# Patient Record
Sex: Male | Born: 1950
Health system: Southern US, Community
[De-identification: ages and names within clinical notes are randomized; demographics above are authoritative.]

## PROBLEM LIST (undated history)

## (undated) DIAGNOSIS — E785 Hyperlipidemia, unspecified: Secondary | ICD-10-CM

## (undated) DIAGNOSIS — J302 Other seasonal allergic rhinitis: Secondary | ICD-10-CM

## (undated) DIAGNOSIS — N529 Male erectile dysfunction, unspecified: Secondary | ICD-10-CM

## (undated) DIAGNOSIS — K219 Gastro-esophageal reflux disease without esophagitis: Secondary | ICD-10-CM

---

## 2006-06-15 ENCOUNTER — Ambulatory Visit: Payer: Self-pay | Admitting: Gastroenterology

## 2006-06-29 ENCOUNTER — Ambulatory Visit: Payer: Self-pay | Admitting: Gastroenterology

## 2006-07-13 ENCOUNTER — Ambulatory Visit: Payer: Self-pay | Admitting: Gastroenterology

## 2006-07-26 ENCOUNTER — Ambulatory Visit: Payer: Self-pay | Admitting: Gastroenterology

## 2009-11-13 ENCOUNTER — Ambulatory Visit: Payer: Self-pay | Admitting: Gastroenterology

## 2014-05-08 DIAGNOSIS — K219 Gastro-esophageal reflux disease without esophagitis: Secondary | ICD-10-CM | POA: Insufficient documentation

## 2014-05-13 ENCOUNTER — Ambulatory Visit: Payer: Self-pay | Admitting: Family Medicine

## 2014-12-23 DIAGNOSIS — J0100 Acute maxillary sinusitis, unspecified: Secondary | ICD-10-CM | POA: Diagnosis not present

## 2015-05-13 DIAGNOSIS — E780 Pure hypercholesterolemia: Secondary | ICD-10-CM | POA: Diagnosis not present

## 2015-05-13 DIAGNOSIS — K219 Gastro-esophageal reflux disease without esophagitis: Secondary | ICD-10-CM | POA: Diagnosis not present

## 2015-05-13 DIAGNOSIS — Z125 Encounter for screening for malignant neoplasm of prostate: Secondary | ICD-10-CM | POA: Diagnosis not present

## 2015-05-13 DIAGNOSIS — Z0000 Encounter for general adult medical examination without abnormal findings: Secondary | ICD-10-CM | POA: Diagnosis not present

## 2015-05-13 DIAGNOSIS — J302 Other seasonal allergic rhinitis: Secondary | ICD-10-CM | POA: Diagnosis not present

## 2015-05-28 DIAGNOSIS — H2513 Age-related nuclear cataract, bilateral: Secondary | ICD-10-CM | POA: Diagnosis not present

## 2015-07-10 ENCOUNTER — Encounter: Payer: Self-pay | Admitting: *Deleted

## 2015-07-13 ENCOUNTER — Ambulatory Visit: Payer: Commercial Managed Care - HMO | Admitting: Anesthesiology

## 2015-07-13 ENCOUNTER — Ambulatory Visit
Admission: RE | Admit: 2015-07-13 | Discharge: 2015-07-13 | Disposition: A | Discharge status: Home / Self Care | Payer: Commercial Managed Care - HMO | Source: Ambulatory Visit | Attending: Gastroenterology | Admitting: Gastroenterology

## 2015-07-13 ENCOUNTER — Encounter
Admission: RE | Disposition: A | Discharge status: Home / Self Care | Payer: Self-pay | Source: Ambulatory Visit | Attending: Gastroenterology

## 2015-07-13 DIAGNOSIS — D123 Benign neoplasm of transverse colon: Secondary | ICD-10-CM | POA: Insufficient documentation

## 2015-07-13 DIAGNOSIS — Z86010 Personal history of colonic polyps: Secondary | ICD-10-CM | POA: Diagnosis not present

## 2015-07-13 DIAGNOSIS — K635 Polyp of colon: Secondary | ICD-10-CM | POA: Diagnosis not present

## 2015-07-13 DIAGNOSIS — E785 Hyperlipidemia, unspecified: Secondary | ICD-10-CM | POA: Diagnosis not present

## 2015-07-13 DIAGNOSIS — D125 Benign neoplasm of sigmoid colon: Secondary | ICD-10-CM | POA: Diagnosis not present

## 2015-07-13 DIAGNOSIS — Z1211 Encounter for screening for malignant neoplasm of colon: Secondary | ICD-10-CM | POA: Diagnosis not present

## 2015-07-13 DIAGNOSIS — D120 Benign neoplasm of cecum: Secondary | ICD-10-CM | POA: Insufficient documentation

## 2015-07-13 DIAGNOSIS — K5790 Diverticulosis of intestine, part unspecified, without perforation or abscess without bleeding: Secondary | ICD-10-CM | POA: Diagnosis not present

## 2015-07-13 DIAGNOSIS — K5730 Diverticulosis of large intestine without perforation or abscess without bleeding: Secondary | ICD-10-CM | POA: Diagnosis not present

## 2015-07-13 HISTORY — PX: COLONOSCOPY WITH PROPOFOL: SHX5780

## 2015-07-13 HISTORY — DX: Gastro-esophageal reflux disease without esophagitis: K21.9

## 2015-07-13 HISTORY — DX: Other seasonal allergic rhinitis: J30.2

## 2015-07-13 HISTORY — DX: Male erectile dysfunction, unspecified: N52.9

## 2015-07-13 HISTORY — DX: Hyperlipidemia, unspecified: E78.5

## 2015-07-13 SURGERY — COLONOSCOPY WITH PROPOFOL
Anesthesia: General

## 2015-07-13 MED ORDER — PROPOFOL INFUSION 10 MG/ML OPTIME
INTRAVENOUS | Status: DC | PRN
  Administered 2015-07-13: 140 ug/kg/min via INTRAVENOUS

## 2015-07-13 MED ORDER — SODIUM CHLORIDE 0.9 % IV SOLN
INTRAVENOUS | Status: DC
  Administered 2015-07-13: 1000 mL via INTRAVENOUS

## 2015-07-13 MED ORDER — MIDAZOLAM HCL 2 MG/2ML IJ SOLN
INTRAMUSCULAR | Status: DC | PRN
  Administered 2015-07-13: 1 mg via INTRAVENOUS

## 2015-07-13 MED ORDER — FENTANYL CITRATE (PF) 100 MCG/2ML IJ SOLN
INTRAMUSCULAR | Status: DC | PRN
  Administered 2015-07-13: 50 ug via INTRAVENOUS

## 2015-07-13 NOTE — Op Note (Signed)
[facility] Gastroenterology Patient Name: Alejandro Fox Procedure Date: 07/13/2015 11:17 AM MRN: [id number] Account #: [id number] Date of Birth: [birth year] Admit Type: Outpatient Age: 64 Room: [facility] ENDO ROOM 3 Gender: Male Note Status: Finalized Procedure:         Colonoscopy Indications:       Personal history of colonic polyps Providers:         Lollie Sails, MD Referring MD:      Sofie Hartigan (Referring MD) Medicines:         Monitored Anesthesia Care Complications:     No immediate complications. Procedure:         Pre-Anesthesia Assessment:                    - ASA Grade Assessment: II - A patient with mild systemic                     disease.                    After obtaining informed consent, the colonoscope was                     passed under direct vision. Throughout the procedure, the                     patient's blood pressure, pulse, and oxygen saturations                     were monitored continuously. The Colonoscope was                     introduced through the anus and advanced to the the cecum,                     identified by appendiceal orifice and ileocecal valve. The                     quality of the bowel preparation was good. Findings:      Two sessile polyps were found at the hepatic flexure. The polyps were 1       to 4 mm in size. These polyps were removed with a cold biopsy forceps.       Resection and retrieval were complete.      A 3 mm polyp was found in the cecum. The polyp was flat. The polyp was       removed with a cold snare. Resection and retrieval were complete.      A 2 mm polyp was found in the sigmoid colon. The polyp was sessile. The       polyp was removed with a cold biopsy forceps. Resection and retrieval       were complete.      Multiple small-mouthed diverticula were found in the sigmoid colon, at       the splenic flexure and in the proximal transverse colon.      The retroflexed view of the  distal rectum and anal verge was normal and       showed no anal or rectal abnormalities.      The digital rectal exam was normal. Impression:        - Two 1 to 4 mm polyps at the hepatic flexure. Resected  and retrieved.                    - One 3 mm polyp in the cecum. Resected and retrieved.                    - One 2 mm polyp in the sigmoid colon. Resected and                     retrieved.                    - Diverticulosis in the sigmoid colon, at the splenic                     flexure and in the proximal transverse colon.                    - The distal rectum and anal verge are normal on                     retroflexion view. Recommendation:    - Await pathology results.                    - Telephone GI clinic for pathology results in 1 week. Procedure Code(s): --- Professional ---                    [phone], Colonoscopy, flexible; with removal of tumor(s),                     polyp(s), or other lesion(s) by snare technique                    45380, 48, Colonoscopy, flexible; with biopsy, single or                     multiple Diagnosis Code(s): --- Professional ---                    211.3, Benign neoplasm of colon                    V12.72, Personal history of colonic polyps                    562.10, Diverticulosis of colon (without mention of                     hemorrhage) CPT copyright 2014 American Medical Association. All rights reserved. The codes documented in this report are preliminary and upon coder review may  be revised to meet current compliance requirements. Lollie Sails, MD 07/13/2015 11:52:54 AM This report has been signed electronically. Number of Addenda: 0 Note Initiated On: 07/13/2015 11:17 AM Scope Withdrawal Time: 0 hours 14 minutes 7 seconds  Total Procedure Duration: 0 hours 23 minutes 12 seconds       [facility]

## 2015-07-13 NOTE — Anesthesia Postprocedure Evaluation (Signed)
  Anesthesia Post-op Note  Patient: Alejandro Fox  Procedure(s) Performed: Procedure(s): COLONOSCOPY WITH PROPOFOL (N/A)  Anesthesia type:General  Patient location: PACU  Post pain: Pain level controlled  Post assessment: Post-op Vital signs reviewed, Patient's Cardiovascular Status Stable, Respiratory Function Stable, Patent Airway and No signs of Nausea or vomiting  Post vital signs: Reviewed and stable  Last Vitals:  Filed Vitals:   07/13/15 1230  BP:   Pulse: 50  Temp:   Resp: 13    Level of consciousness: awake, alert  and patient cooperative  Complications: No apparent anesthesia complications

## 2015-07-13 NOTE — Transfer of Care (Signed)
Immediate Anesthesia Transfer of Care Note  Patient: Alejandro Fox  Procedure(s) Performed: Procedure(s): COLONOSCOPY WITH PROPOFOL (N/A)  Patient Location: PACU  Anesthesia Type:General  Level of Consciousness: sedated and responds to stimulation  Airway & Oxygen Therapy: Patient Spontanous Breathing and Patient connected to nasal cannula oxygen  Post-op Assessment: Report given to RN and Post -op Vital signs reviewed and stable  Post vital signs: Reviewed and stable  Last Vitals:  Filed Vitals:   07/13/15 1155  BP: 110/71  Pulse: 55  Temp: 36 C  Resp: 16    Complications: No apparent anesthesia complications

## 2015-07-13 NOTE — Anesthesia Preprocedure Evaluation (Signed)
Anesthesia Evaluation  Patient identified by MRN, date of birth, ID band Patient awake    Reviewed: Allergy & Precautions, H&P , NPO status , Patient's Chart, lab work & pertinent test results, reviewed documented beta blocker date and time   History of Anesthesia Complications Negative for: history of anesthetic complications  Airway Mallampati: II  TM Distance: >3 FB Neck ROM: full    Dental no notable dental hx.    Pulmonary neg COPDCurrent Smoker,  breath sounds clear to auscultation  Pulmonary exam normal       Cardiovascular Exercise Tolerance: Good negative cardio ROS Normal cardiovascular examRhythm:regular Rate:Normal     Neuro/Psych negative neurological ROS  negative psych ROS   GI/Hepatic Neg liver ROS, GERD-  Medicated and Controlled,  Endo/Other  negative endocrine ROS  Renal/GU negative Renal ROS  negative genitourinary   Musculoskeletal   Abdominal   Peds  Hematology negative hematology ROS (+)   Anesthesia Other Findings Past Medical History:   Seasonal allergies                                           Erectile dysfunction                                         Hyperlipidemia                                               GERD (gastroesophageal reflux disease)                       Reproductive/Obstetrics negative OB ROS                             Anesthesia Physical Anesthesia Plan  ASA: II  Anesthesia Plan: General   Post-op Pain Management:    Induction:   Airway Management Planned:   Additional Equipment:   Intra-op Plan:   Post-operative Plan:   Informed Consent: I have reviewed the patients History and Physical, chart, labs and discussed the procedure including the risks, benefits and alternatives for the proposed anesthesia with the patient or authorized representative who has indicated his/her understanding and acceptance.   Dental Advisory  Given  Plan Discussed with: Anesthesiologist, CRNA and Surgeon  Anesthesia Plan Comments:         Anesthesia Quick Evaluation

## 2015-07-13 NOTE — H&P (Signed)
Outpatient short stay form Pre-procedure 07/13/2015 11:06 AM Lollie Sails MD  Primary Physician: Dr. Thereasa Distance  Reason for visit:  Colonoscopy  History of present illness:  Patient is a 64 year old male with a personal history of adenomatous colon polyps. It has been 5 years since his last colonoscopy and he is presenting for routine follow-up colonoscopy. He tolerated his prep well. He takes no aspirin or NSAID products. Is no family history of colon cancer or colon polyps.    Current facility-administered medications:  .  0.9 %  sodium chloride infusion, , Intravenous, Continuous, Lollie Sails, MD, Last Rate: 20 mL/hr at 07/13/15 1040, 1,000 mL at 07/13/15 1040  Prescriptions prior to admission  Medication Sig Dispense Refill Last Dose  . cholecalciferol (VITAMIN D) 400 UNITS TABS tablet Take 1,000 Units by mouth.     . Multiple Vitamin (MULTIVITAMIN WITH MINERALS) TABS tablet Take 1 tablet by mouth daily.     Marland Kitchen omega-3 acid ethyl esters (LOVAZA) 1 G capsule Take 2 g by mouth daily.     . tadalafil (CIALIS) 20 MG tablet Take 20 mg by mouth daily as needed for erectile dysfunction.        Allergies  Allergen Reactions  . Ibuprofen Other (See Comments)     Past Medical History  Diagnosis Date  . Seasonal allergies   . Erectile dysfunction   . Hyperlipidemia   . GERD (gastroesophageal reflux disease)     Review of systems:      Physical Exam    Heart and lungs: Regular rate and rhythm without rub or gallop lungs are bilaterally clear    HEENT: Normocephalic atraumatic eyes are anicteric    Other:     Pertinant exam for procedure: Soft nontender nondistended bowel sounds positive normoactive    Planned proceedures: Colonoscopy and indicated procedures I have discussed the risks benefits and complications of procedures to include not limited to bleeding, infection, perforation and the risk of sedation and the patient wishes to proceed.    Lollie Sails, MD Gastroenterology 07/13/2015  11:06 AM

## 2015-07-14 ENCOUNTER — Encounter: Payer: Self-pay | Admitting: Gastroenterology

## 2015-07-14 LAB — SURGICAL PATHOLOGY

## 2015-08-19 DIAGNOSIS — M7521 Bicipital tendinitis, right shoulder: Secondary | ICD-10-CM | POA: Diagnosis not present

## 2016-05-17 DIAGNOSIS — J301 Allergic rhinitis due to pollen: Secondary | ICD-10-CM | POA: Diagnosis not present

## 2016-05-17 DIAGNOSIS — F17220 Nicotine dependence, chewing tobacco, uncomplicated: Secondary | ICD-10-CM | POA: Diagnosis not present

## 2016-05-17 DIAGNOSIS — Z720 Tobacco use: Secondary | ICD-10-CM | POA: Diagnosis not present

## 2016-05-17 DIAGNOSIS — E7800 Pure hypercholesterolemia, unspecified: Secondary | ICD-10-CM | POA: Diagnosis not present

## 2016-05-17 DIAGNOSIS — Z0000 Encounter for general adult medical examination without abnormal findings: Secondary | ICD-10-CM | POA: Diagnosis not present

## 2016-05-31 DIAGNOSIS — H401130 Primary open-angle glaucoma, bilateral, stage unspecified: Secondary | ICD-10-CM | POA: Diagnosis not present

## 2016-05-31 DIAGNOSIS — H2513 Age-related nuclear cataract, bilateral: Secondary | ICD-10-CM | POA: Diagnosis not present

## 2016-06-16 DIAGNOSIS — R319 Hematuria, unspecified: Secondary | ICD-10-CM | POA: Diagnosis not present

## 2016-07-01 ENCOUNTER — Other Ambulatory Visit: Payer: Self-pay | Admitting: Family Medicine

## 2016-07-01 DIAGNOSIS — N200 Calculus of kidney: Secondary | ICD-10-CM

## 2016-07-07 ENCOUNTER — Ambulatory Visit: Payer: Medicare HMO

## 2016-07-12 ENCOUNTER — Ambulatory Visit
Admission: RE | Admit: 2016-07-12 | Discharge: 2016-07-12 | Disposition: A | Discharge status: Home / Self Care | Payer: Commercial Managed Care - HMO | Source: Ambulatory Visit | Attending: Family Medicine | Admitting: Family Medicine

## 2016-07-12 DIAGNOSIS — N200 Calculus of kidney: Secondary | ICD-10-CM | POA: Diagnosis not present

## 2016-07-12 DIAGNOSIS — I700 Atherosclerosis of aorta: Secondary | ICD-10-CM | POA: Insufficient documentation

## 2016-07-12 DIAGNOSIS — N400 Enlarged prostate without lower urinary tract symptoms: Secondary | ICD-10-CM | POA: Insufficient documentation

## 2016-11-01 DIAGNOSIS — M5442 Lumbago with sciatica, left side: Secondary | ICD-10-CM | POA: Diagnosis not present

## 2016-11-01 DIAGNOSIS — Z23 Encounter for immunization: Secondary | ICD-10-CM | POA: Diagnosis not present

## 2016-11-01 DIAGNOSIS — I700 Atherosclerosis of aorta: Secondary | ICD-10-CM | POA: Insufficient documentation

## 2017-01-27 DIAGNOSIS — J301 Allergic rhinitis due to pollen: Secondary | ICD-10-CM | POA: Insufficient documentation

## 2017-01-27 DIAGNOSIS — J029 Acute pharyngitis, unspecified: Secondary | ICD-10-CM | POA: Diagnosis not present

## 2017-01-27 DIAGNOSIS — R030 Elevated blood-pressure reading, without diagnosis of hypertension: Secondary | ICD-10-CM | POA: Diagnosis not present

## 2017-05-18 ENCOUNTER — Other Ambulatory Visit: Payer: Self-pay | Admitting: Family Medicine

## 2017-05-18 DIAGNOSIS — Z136 Encounter for screening for cardiovascular disorders: Secondary | ICD-10-CM

## 2017-05-18 DIAGNOSIS — R7309 Other abnormal glucose: Secondary | ICD-10-CM | POA: Diagnosis not present

## 2017-05-18 DIAGNOSIS — M2550 Pain in unspecified joint: Secondary | ICD-10-CM | POA: Diagnosis not present

## 2017-05-18 DIAGNOSIS — E7800 Pure hypercholesterolemia, unspecified: Secondary | ICD-10-CM | POA: Diagnosis not present

## 2017-05-18 DIAGNOSIS — N200 Calculus of kidney: Secondary | ICD-10-CM | POA: Diagnosis not present

## 2017-05-18 DIAGNOSIS — Z125 Encounter for screening for malignant neoplasm of prostate: Secondary | ICD-10-CM | POA: Diagnosis not present

## 2017-05-18 DIAGNOSIS — Z0000 Encounter for general adult medical examination without abnormal findings: Secondary | ICD-10-CM | POA: Diagnosis not present

## 2017-05-28 IMAGING — CT CT RENAL STONE PROTOCOL
1 of 2 series · 15 of 32 positions shown, 19 images · non-contrast
Comparison: 07/13/2006

CLINICAL DATA: Microscopic hematuria.

EXAM:
CT ABDOMEN AND PELVIS WITHOUT CONTRAST
TECHNIQUE: Multidetector CT imaging of the abdomen and pelvis was performed
following the standard protocol without IV contrast.

[Series 2: axial st · axial · 0.64mm/px · z∈[-886,-510]mm · 15 of 83 slices shown, 19 images]
[im 4/83  soft-tissue]
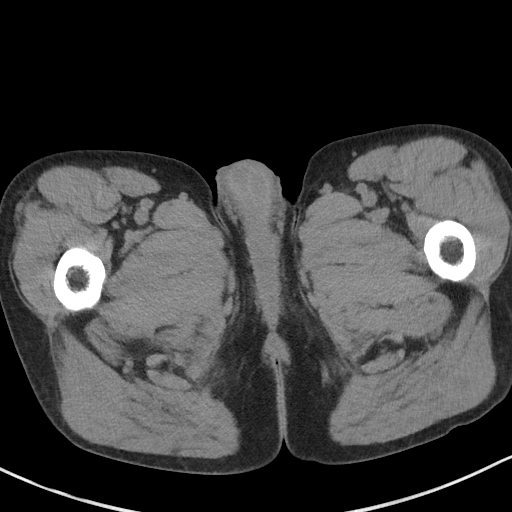
[im 4/83  bone]
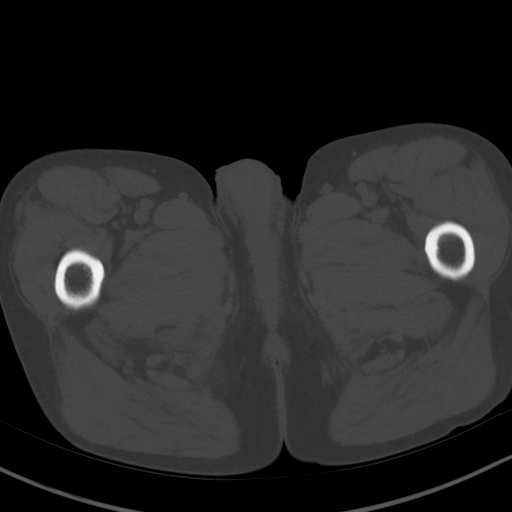
[im 11/83  soft-tissue]
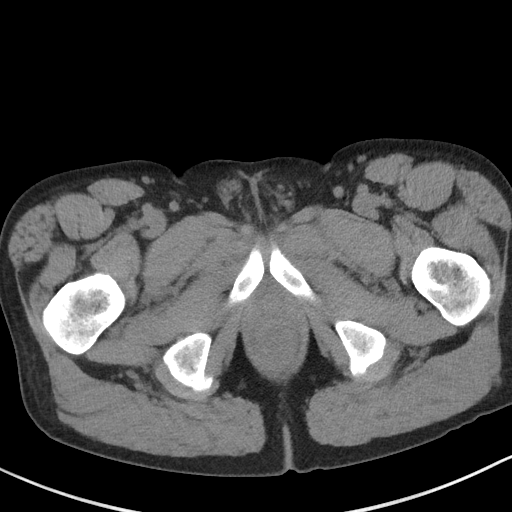
[im 18/83  soft-tissue]
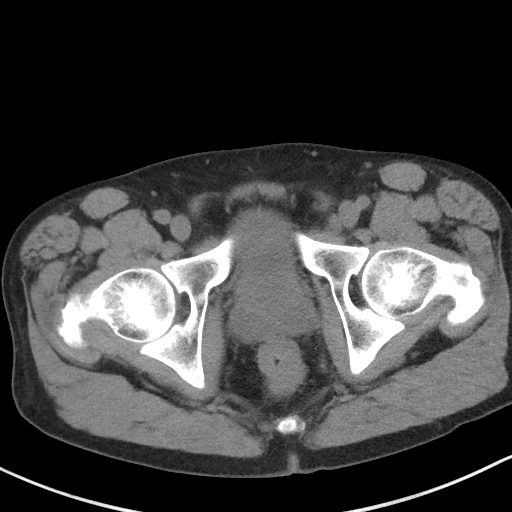
[im 24/83  soft-tissue]
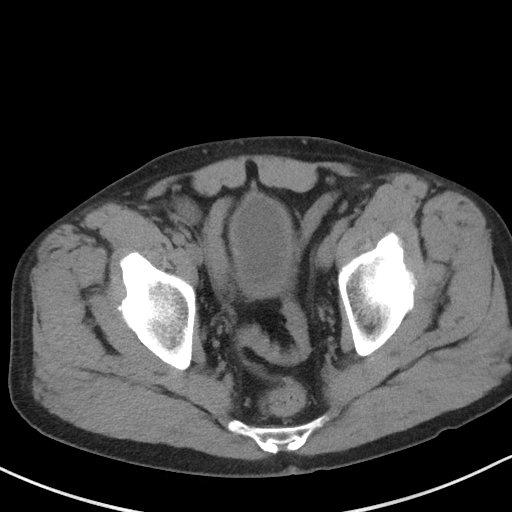
[im 28/83  soft-tissue]
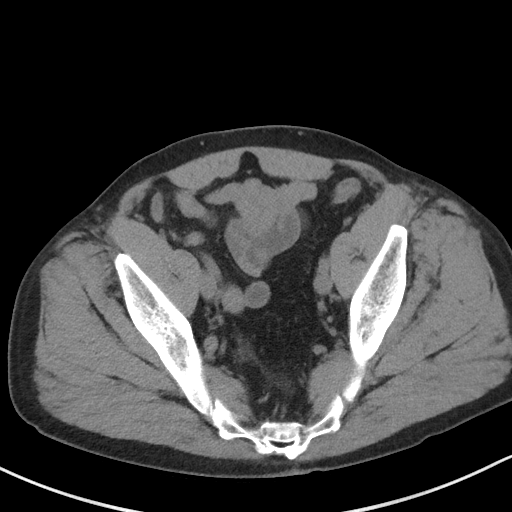
[im 35/83  soft-tissue]
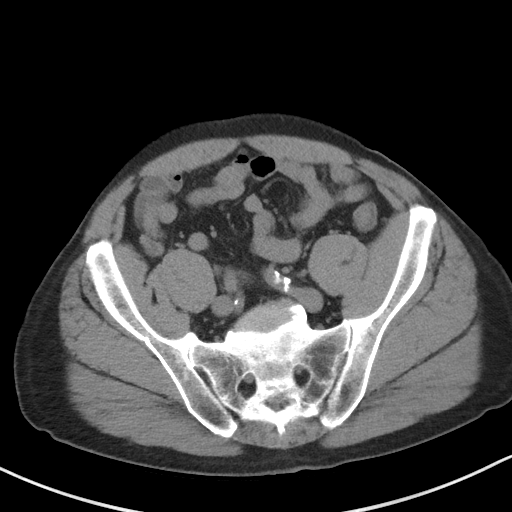
[im 42/83  soft-tissue]
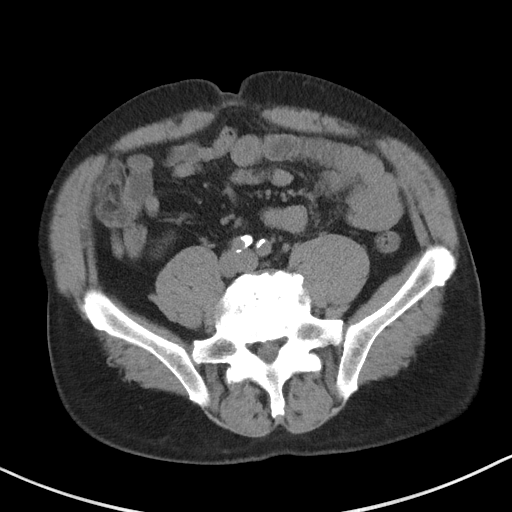
[im 48/83  soft-tissue]
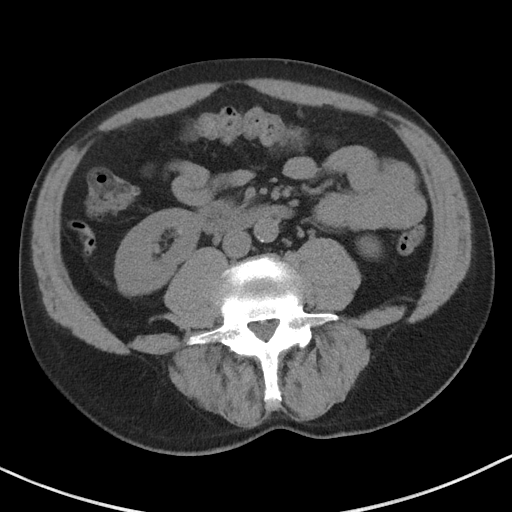
[im 55/83  soft-tissue]
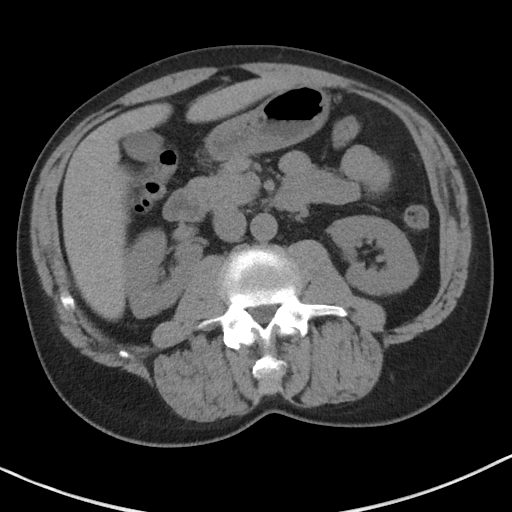
[im 55/83  bone]
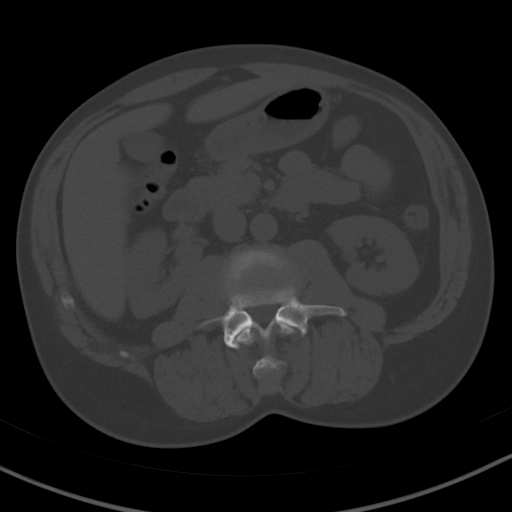
[im 59/83  soft-tissue]
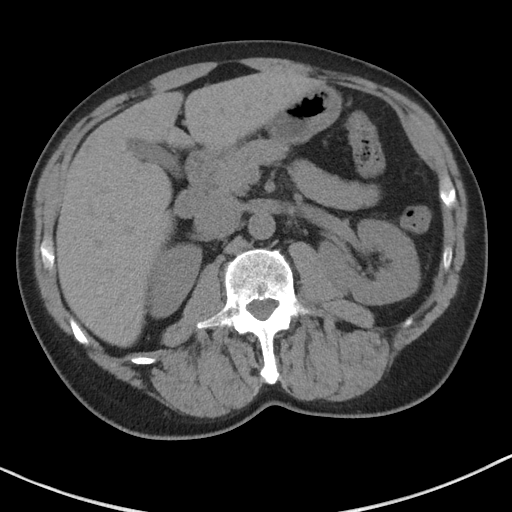
[im 65/83  soft-tissue]
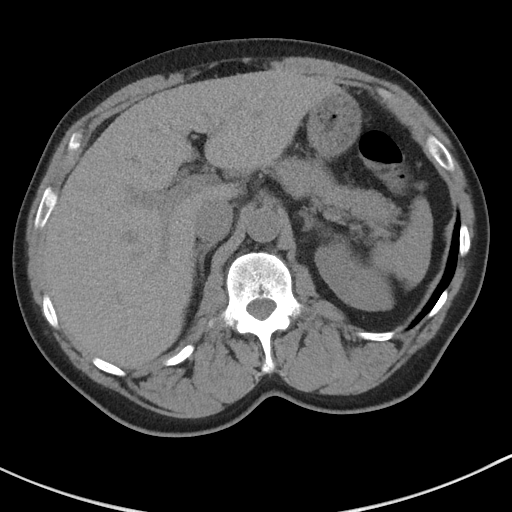
[im 69/83  lung]
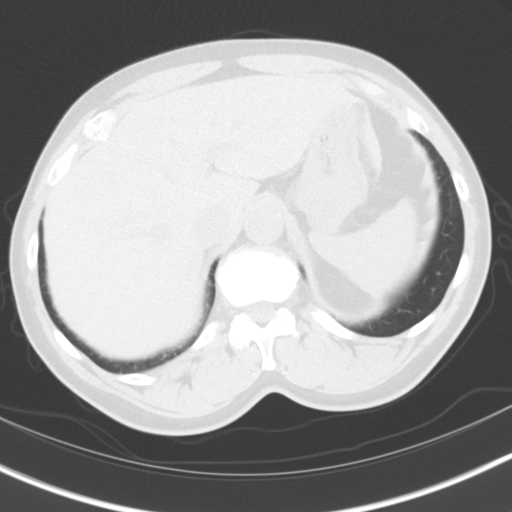
[im 72/83  soft-tissue]
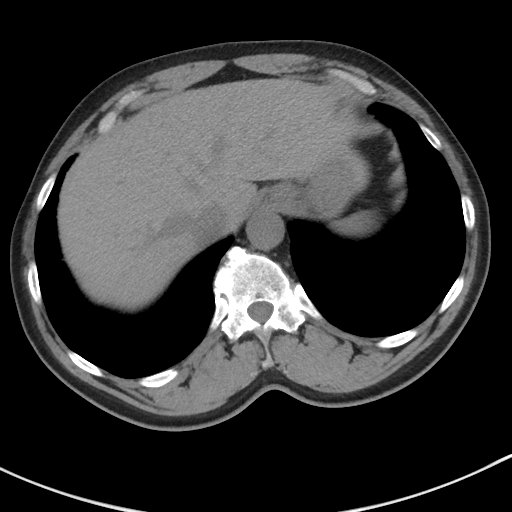
[im 72/83  lung]
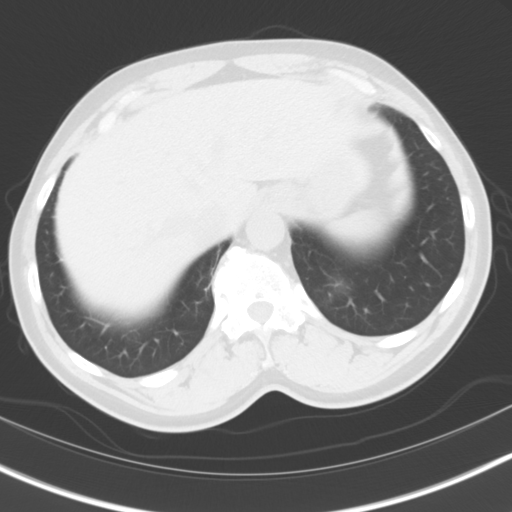
[im 76/83  lung]
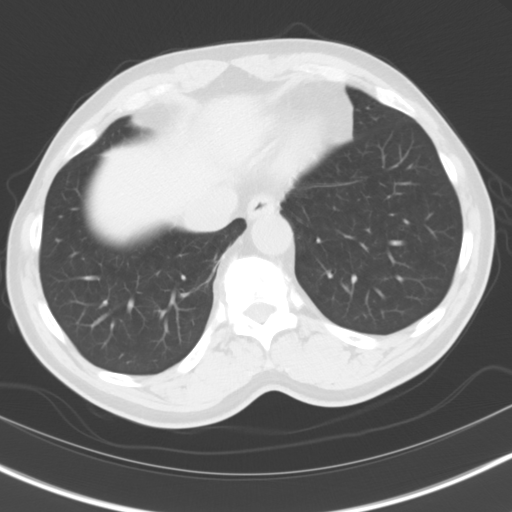
[im 79/83  soft-tissue]
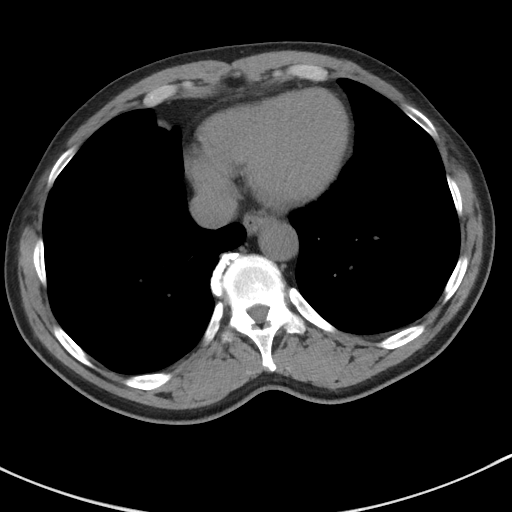
[im 79/83  lung]
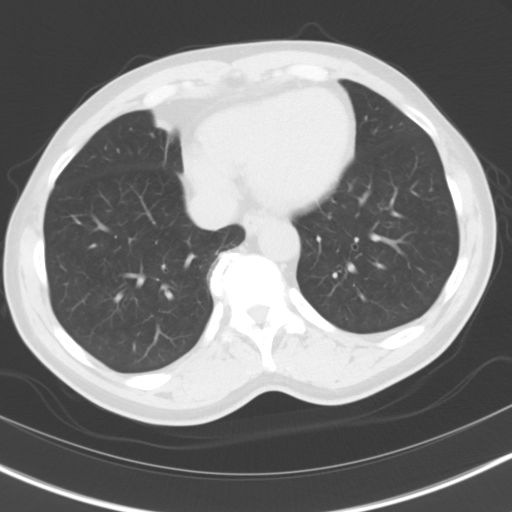

[15 of 32 positions shown; findings below may reference images not displayed]

FINDINGS: Lower chest:  Unremarkable.

Hepatobiliary: No focal abnormality in the liver on this study
without intravenous contrast. There is no evidence for gallstones,
gallbladder wall thickening, or pericholecystic fluid. No
intrahepatic or extrahepatic biliary dilation.

Pancreas: No focal mass lesion. No dilatation of the main duct. No
intraparenchymal cyst. No peripancreatic edema.

Spleen: No splenomegaly. No focal mass lesion.

Adrenals/Urinary Tract: No adrenal nodule or mass. Adjacent
nonobstructing interpolar right renal stones measure up to 6 mm
each. No stones are seen in the left kidney. No ureteral or bladder
stones.

Stomach/Bowel: Stomach is nondistended. No gastric wall thickening.
No evidence of outlet obstruction. Duodenum is normally positioned
as is the ligament of Treitz. No small bowel wall thickening. No
small bowel dilatation. The terminal ileum is normal. The appendix
is normal. No gross colonic mass. No colonic wall thickening. No
substantial diverticular change.

Vascular/Lymphatic: There is abdominal aortic atherosclerosis
without aneurysm. There is no gastrohepatic or hepatoduodenal
ligament lymphadenopathy. No intraperitoneal or retroperitoneal
lymphadenopathy. No pelvic sidewall lymphadenopathy.

Reproductive: Prostate gland is enlarged.

Other: No intraperitoneal free fluid.

Musculoskeletal: Bone windows reveal no worrisome lytic or sclerotic
osseous lesions.
IMPRESSION: 1. Nonobstructing right renal stones measuring up to 6 mm.
2. Prostatomegaly.
3. Abdominal aortic atherosclerosis.

## 2017-05-29 ENCOUNTER — Ambulatory Visit
Admission: RE | Admit: 2017-05-29 | Discharge: 2017-05-29 | Disposition: A | Discharge status: Home / Self Care | Payer: Medicare HMO | Source: Ambulatory Visit | Attending: Family Medicine | Admitting: Family Medicine

## 2017-05-29 DIAGNOSIS — Z136 Encounter for screening for cardiovascular disorders: Secondary | ICD-10-CM | POA: Diagnosis not present

## 2017-05-29 DIAGNOSIS — I714 Abdominal aortic aneurysm, without rupture: Secondary | ICD-10-CM | POA: Diagnosis not present

## 2017-06-13 DIAGNOSIS — H40019 Open angle with borderline findings, low risk, unspecified eye: Secondary | ICD-10-CM | POA: Diagnosis not present

## 2017-06-13 DIAGNOSIS — S0502XA Injury of conjunctiva and corneal abrasion without foreign body, left eye, initial encounter: Secondary | ICD-10-CM | POA: Diagnosis not present

## 2017-06-13 DIAGNOSIS — H401130 Primary open-angle glaucoma, bilateral, stage unspecified: Secondary | ICD-10-CM | POA: Diagnosis not present

## 2017-06-13 DIAGNOSIS — H2513 Age-related nuclear cataract, bilateral: Secondary | ICD-10-CM | POA: Diagnosis not present

## 2017-07-24 DIAGNOSIS — I739 Peripheral vascular disease, unspecified: Secondary | ICD-10-CM | POA: Diagnosis not present

## 2017-07-24 DIAGNOSIS — M2021 Hallux rigidus, right foot: Secondary | ICD-10-CM | POA: Diagnosis not present

## 2017-07-24 DIAGNOSIS — M79671 Pain in right foot: Secondary | ICD-10-CM | POA: Diagnosis not present

## 2017-09-24 ENCOUNTER — Ambulatory Visit
Admission: EM | Admit: 2017-09-24 | Discharge: 2017-09-24 | Disposition: A | Discharge status: Home / Self Care | Payer: Medicare HMO | Attending: Emergency Medicine | Admitting: Emergency Medicine

## 2017-09-24 ENCOUNTER — Encounter: Payer: Self-pay | Admitting: Gynecology

## 2017-09-24 DIAGNOSIS — H5789 Other specified disorders of eye and adnexa: Secondary | ICD-10-CM | POA: Diagnosis not present

## 2017-09-24 MED ORDER — KETOROLAC TROMETHAMINE 0.5 % OP SOLN: OPHTHALMIC | 0 refills | Status: DC

## 2017-09-24 NOTE — ED Provider Notes (Signed)
HPI  SUBJECTIVE:  Alejandro Fox is a 66 y.o. male who presents with a foreign body sensation in his right eye underneath his upper eyelid for the past 3 days. States his symptoms may have started after mowing the lawn but is not sure. He reports blurry vision in this eye and photophobia when going out into the direct sunlight. He reports eye redness. He describes his eye pain as soreness, burning. Has tried Systane and saline irrigation with some improvement in his symptoms. Symptoms are worse with going onto the direct sunlight. No nausea, vomiting, fevers, headaches, increased tearing, discharge. No periorbital erythema, edema, facial rash. No halos around lights, pain worse, and dark. No antipyretic in the past 6-8 hours. He doesn't wear contacts. He wears prescription reading glasses. past medical history negative for diabetes, glaucoma. Tetanus is up-to-date. Ophthalmology: Dr. Clydene Laming in [facility].   Past Medical History:  Diagnosis Date  . Erectile dysfunction   . GERD (gastroesophageal reflux disease)   . Hyperlipidemia   . Seasonal allergies     Past Surgical History:  Procedure Laterality Date  . COLONOSCOPY WITH PROPOFOL N/A 07/13/2015   Procedure: COLONOSCOPY WITH PROPOFOL;  Surgeon: Lollie Sails, MD;  Location: [facility] ENDOSCOPY;  Service: Endoscopy;  Laterality: N/A;    No family history on file.  Social History  Substance Use Topics  . Smoking status: Current Some Day Smoker  . Smokeless tobacco: Never Used  . Alcohol use Yes    No current facility-administered medications for this encounter.   Current Outpatient Prescriptions:  .  cholecalciferol (VITAMIN D) 400 UNITS TABS tablet, Take 1,000 Units by mouth., Disp: , Rfl:  .  Multiple Vitamin (MULTIVITAMIN WITH MINERALS) TABS tablet, Take 1 tablet by mouth daily., Disp: , Rfl:  .  omega-3 acid ethyl esters (LOVAZA) 1 G capsule, Take 2 g by mouth daily., Disp: , Rfl:  .  tadalafil (CIALIS) 20 MG tablet, Take 20 mg by  mouth daily as needed for erectile dysfunction., Disp: , Rfl:  .  ketorolac (ACULAR) 0.5 % ophthalmic solution, 1 drop in affected eye 4 times a day x 5 days, Disp: 3 mL, Rfl: 0  Allergies  Allergen Reactions  . Ibuprofen Other (See Comments)     ROS  As noted in HPI.   Physical Exam  BP (!) 145/74 (BP Location: Left Arm)   Pulse 65   Temp 98.9 F (37.2 C) (Oral)   Ht 5\' 6"  (1.676 m)   Wt 153 lb (69.4 kg)   SpO2 100%   BMI 24.69 kg/m   Constitutional: Well developed, well nourished, no acute distress Eyes: PERRLA, no pain with EOMI, diffuse conjunctival injection right eye. Positive direct photophobia. No consensual photophobia. no foreign body seen on lid eversion. No corneal foreign body or abrasion seen on magnification or flourescin exam. Negative Seidel. No periorbital erythema, edema. pain relieved with the tetracaine.   Visual Acuity  Right Eye Distance: 20/70 Left Eye Distance: 20/40 Bilateral Distance: 20/40 (without corrective lens)  Right Eye Near:   Left Eye Near:    Bilateral Near:     HENT: Normocephalic, atraumatic,mucus membranes moist   Respiratory: Normal inspiratory effort Cardiovascular: Normal rate GI: nondistended skin: No facial rash, periorbital erythema, edema. skin intact Musculoskeletal: no deformities Neurologic: Alert & oriented x 3, no focal neuro deficits Psychiatric: Speech and behavior appropriate   ED Course   Medications - No data to display  No orders of the defined types were placed in this encounter.  No results found for this or any previous visit (from the past 24 hour(s)). No results found.  ED Clinical Impression  Eye irritation   ED Assessment/Plan   presentation suggestive but not completely consistent with of iritis with the decreased visual acuity, photophobia and diffuse conjunctival injection, however iritis usually does not get better with the tetracaine. States normally visual acuity is equal. unable  to find a foreign body or corneal abrasion. May have a small foreign body underneath his lid that I was unable to appreciate on exam. No Evidence of keratitis, ocular herpes simplex, Doubt infection or glaucoma. patient to try irrigation with saline, cool compresses, continue Systane. We'll try ketorolac eyedrops for inflammation. He can follow-up with his ophthalmologist or We'll refer to Dr. Wallace Going,  ophthalmology on-call for follow-up in 24-48 hours.   Discussed  MDM, plan and followup with patient. Discussed sn/sx that should prompt return to the ED. Patient  agrees with plan.   Meds ordered this encounter  Medications  . ketorolac (ACULAR) 0.5 % ophthalmic solution    Sig: 1 drop in affected eye 4 times a day x 5 days    Dispense:  3 mL    Refill:  0    *This clinic note was created using [employer]obby[employer]. Therefore, there may be occasional mistakes despite careful proofreading.  ?   Melynda Ripple, MD 09/24/17 1512

## 2017-09-24 NOTE — Discharge Instructions (Signed)
Cold compresses, Systane, wear sunglasses if the light bothers you. Try the anti-inflammatory eyedrops unless ophthalmologist tells you to stop.

## 2017-09-24 NOTE — ED Triage Notes (Signed)
Per patient couple days mowing yard and not sure if something got into his eye. Patient c/o right eye pain and irritation/ redness.

## 2017-10-01 IMAGING — US US RETROPERITONEAL COMPLETE
1 series · 14 of 25 positions shown · non-contrast
Comparison: CT dated 07/12/2016

CLINICAL DATA: Screening for abdominal aortic aneurysm.

EXAM:
ULTRASOUND RETROPERITONEAL COMPLETE
TECHNIQUE: Ultrasound examination of the abdominal aorta was performed to
evaluate for abdominal aortic aneurysm. The common iliac arteries,
IVC, and kidneys were also evaluated.

[Series 1: us retroperitoneal complete · 0.23mm/px · 14 of 51 slices shown]
[im 1/51]
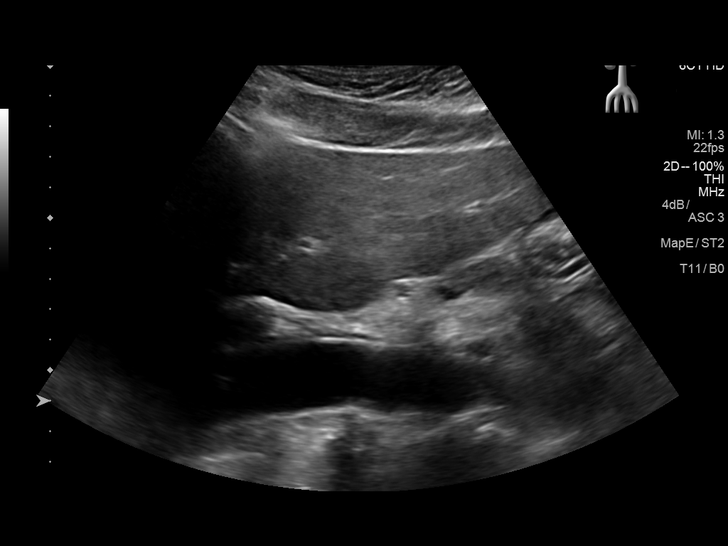
[im 5/51]
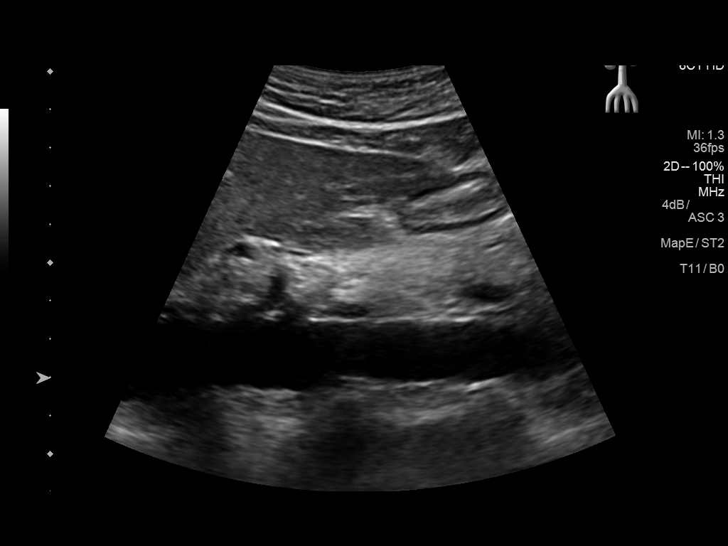
[im 9/51]
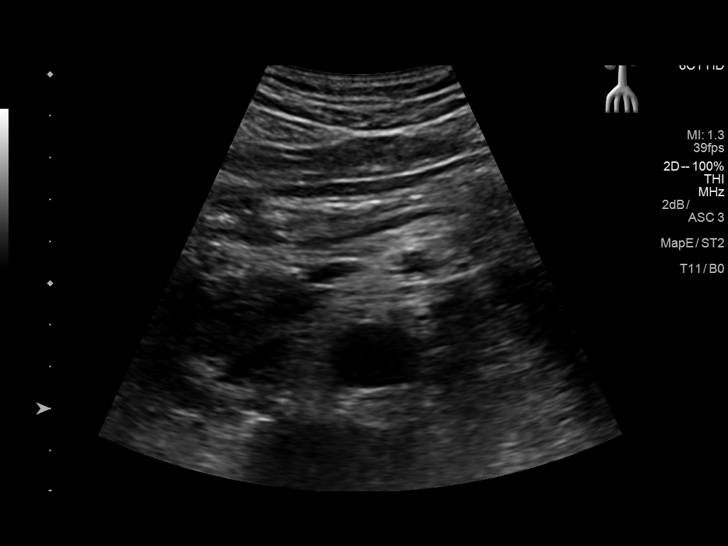
[im 13/51]
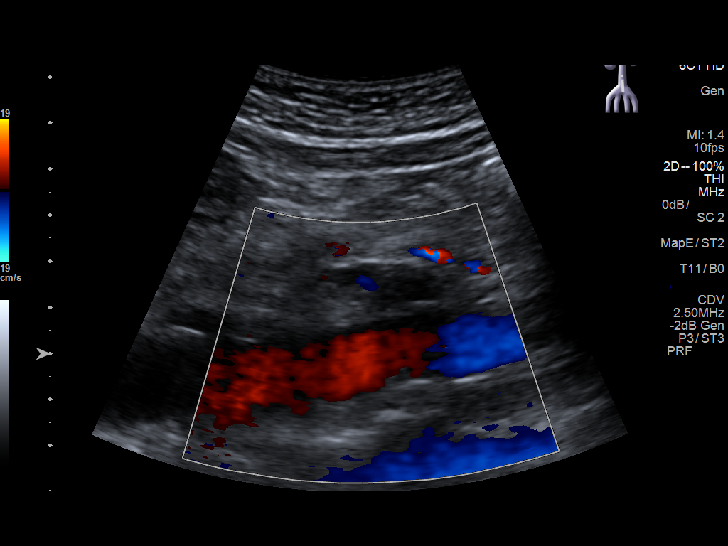
[im 17/51]
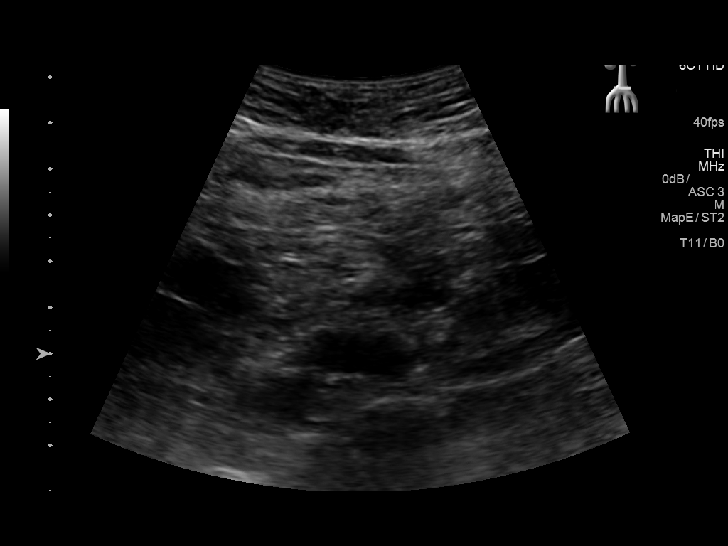
[im 19/51]
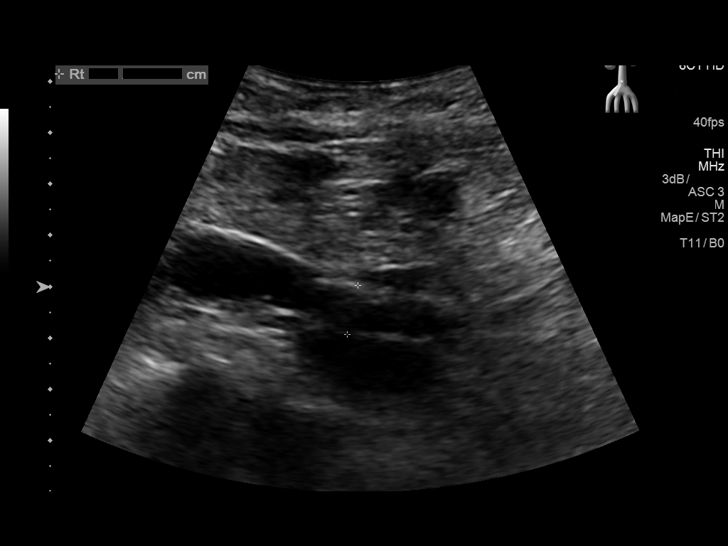
[im 23/51]
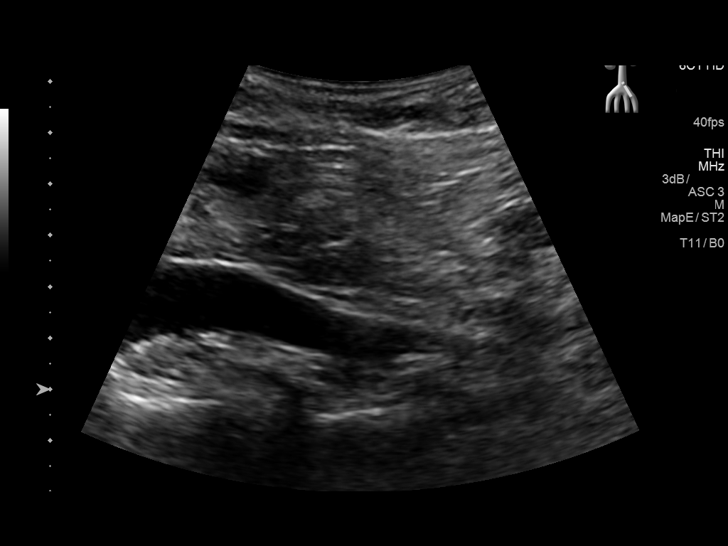
[im 28/51]
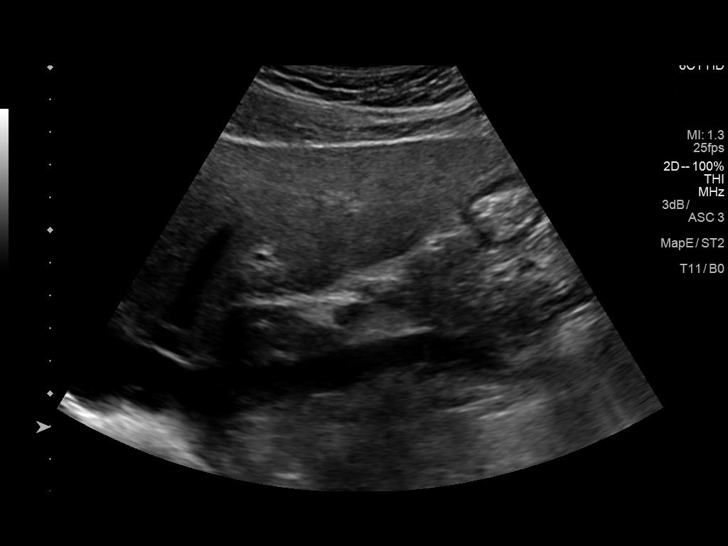
[im 32/51]
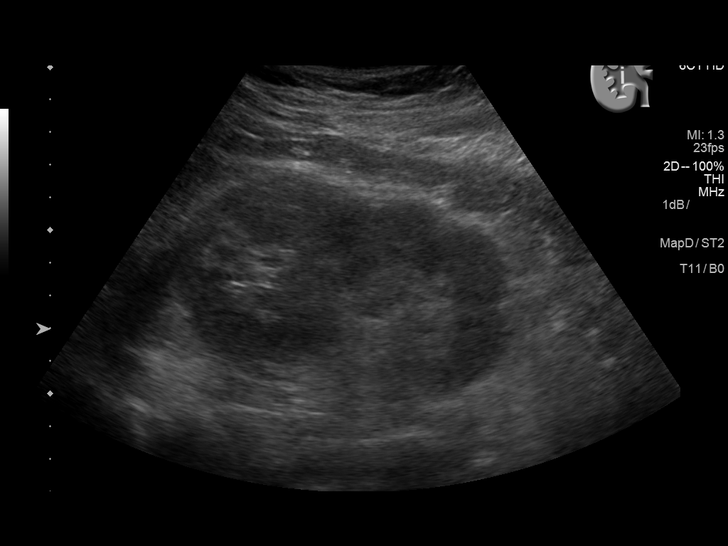
[im 34/51]
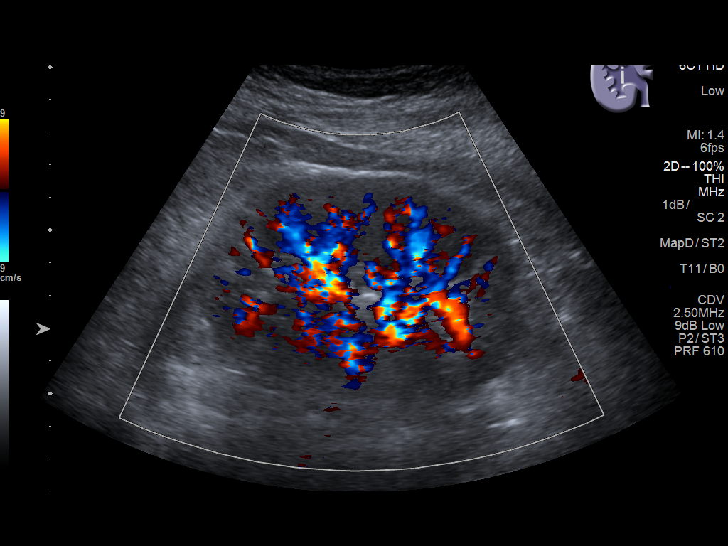
[im 38/51]
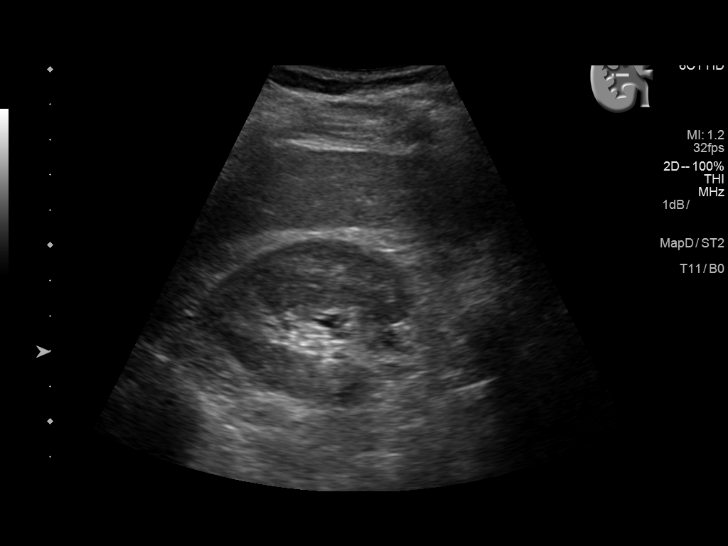
[im 42/51]
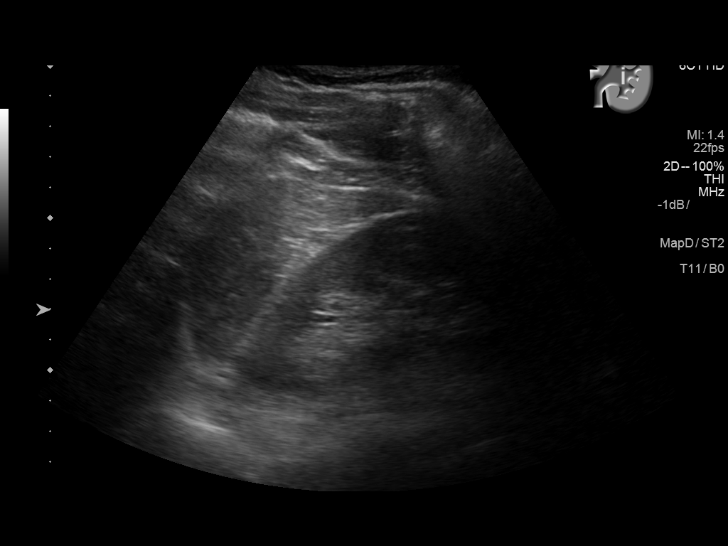
[im 46/51]
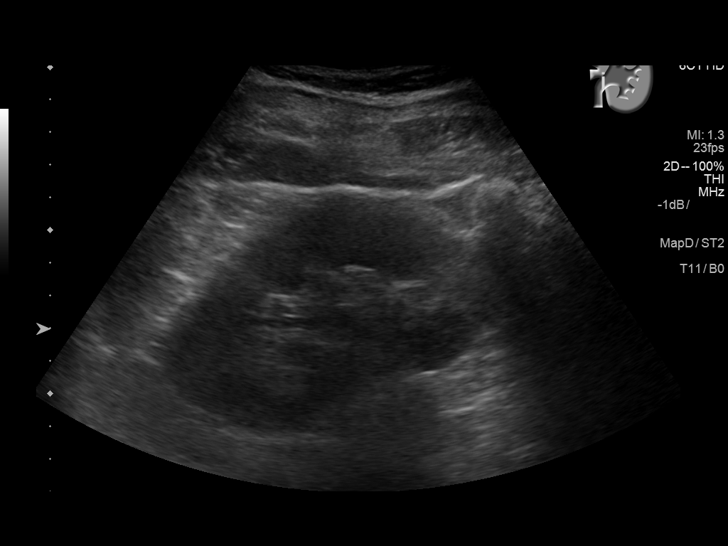
[im 51/51]
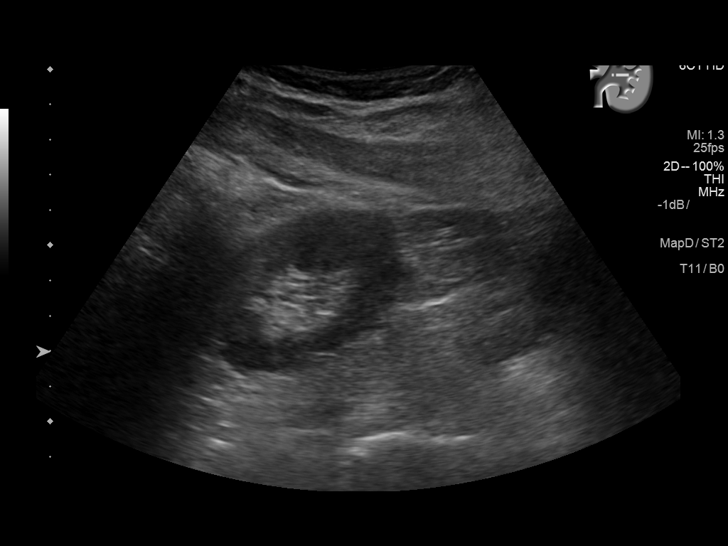

[14 of 25 positions shown; findings below may reference images not displayed]

FINDINGS: Abdominal Aorta

No aneurysm identified.

Maximum AP

Diameter:  2.7 cm

Maximum TRV

Diameter: 2.4 cm

Right Common Iliac Artery

No aneurysm identified.

Left Common Iliac Artery

No aneurysm identified.

IVC

No abnormality visualized.

Right Kidney

Length: 10.4 cm. Echogenicity within normal limits. No mass or
hydronephrosis visualized.

Left Kidney

Length: 10.3 cm. Echogenicity within normal limits. No mass or
hydronephrosis visualized.
IMPRESSION: Negative for an abdominal aortic aneurysm.

Normal appearance of both kidneys.

## 2017-12-04 DIAGNOSIS — M2550 Pain in unspecified joint: Secondary | ICD-10-CM | POA: Diagnosis not present

## 2017-12-04 DIAGNOSIS — R739 Hyperglycemia, unspecified: Secondary | ICD-10-CM | POA: Diagnosis not present

## 2017-12-04 DIAGNOSIS — N200 Calculus of kidney: Secondary | ICD-10-CM | POA: Diagnosis not present

## 2017-12-04 DIAGNOSIS — R7302 Impaired glucose tolerance (oral): Secondary | ICD-10-CM | POA: Diagnosis not present

## 2017-12-04 DIAGNOSIS — E785 Hyperlipidemia, unspecified: Secondary | ICD-10-CM | POA: Diagnosis not present

## 2017-12-04 DIAGNOSIS — E7800 Pure hypercholesterolemia, unspecified: Secondary | ICD-10-CM | POA: Diagnosis not present

## 2017-12-26 DIAGNOSIS — S46911A Strain of unspecified muscle, fascia and tendon at shoulder and upper arm level, right arm, initial encounter: Secondary | ICD-10-CM | POA: Diagnosis not present

## 2018-05-21 ENCOUNTER — Ambulatory Visit
Admission: EM | Admit: 2018-05-21 | Discharge: 2018-05-21 | Disposition: A | Discharge status: Home / Self Care | Payer: Medicare HMO | Attending: Family Medicine | Admitting: Family Medicine

## 2018-05-21 ENCOUNTER — Other Ambulatory Visit: Payer: Self-pay

## 2018-05-21 DIAGNOSIS — E785 Hyperlipidemia, unspecified: Secondary | ICD-10-CM | POA: Insufficient documentation

## 2018-05-21 DIAGNOSIS — K219 Gastro-esophageal reflux disease without esophagitis: Secondary | ICD-10-CM | POA: Insufficient documentation

## 2018-05-21 DIAGNOSIS — Z79899 Other long term (current) drug therapy: Secondary | ICD-10-CM | POA: Insufficient documentation

## 2018-05-21 DIAGNOSIS — R079 Chest pain, unspecified: Secondary | ICD-10-CM | POA: Diagnosis not present

## 2018-05-21 DIAGNOSIS — N529 Male erectile dysfunction, unspecified: Secondary | ICD-10-CM | POA: Diagnosis not present

## 2018-05-21 DIAGNOSIS — R1010 Upper abdominal pain, unspecified: Secondary | ICD-10-CM

## 2018-05-21 DIAGNOSIS — Z833 Family history of diabetes mellitus: Secondary | ICD-10-CM | POA: Insufficient documentation

## 2018-05-21 DIAGNOSIS — F17200 Nicotine dependence, unspecified, uncomplicated: Secondary | ICD-10-CM | POA: Insufficient documentation

## 2018-05-21 DIAGNOSIS — R1013 Epigastric pain: Secondary | ICD-10-CM | POA: Diagnosis not present

## 2018-05-21 DIAGNOSIS — Z886 Allergy status to analgesic agent status: Secondary | ICD-10-CM | POA: Diagnosis not present

## 2018-05-21 LAB — COMPREHENSIVE METABOLIC PANEL
ALT: 24 U/L (ref 17–63)
ANION GAP: 11 (ref 5–15)
AST: 25 U/L (ref 15–41)
Albumin: 4.3 g/dL (ref 3.5–5.0)
Alkaline Phosphatase: 53 U/L (ref 38–126)
BUN: 15 mg/dL (ref 6–20)
CALCIUM: 9.5 mg/dL (ref 8.9–10.3)
CO2: 21 mmol/L — ABNORMAL LOW (ref 22–32)
CREATININE: 0.92 mg/dL (ref 0.61–1.24)
Chloride: 105 mmol/L (ref 101–111)
GFR calc Af Amer: >60 mL/min (ref >60)
GFR calc non Af Amer: >60 mL/min (ref >60)
GLUCOSE: 108 mg/dL — AB (ref 65–99)
Potassium: 4.1 mmol/L (ref 3.5–5.1)
Sodium: 137 mmol/L (ref 135–145)
Total Bilirubin: 0.6 mg/dL (ref 0.3–1.2)
Total Protein: 7.8 g/dL (ref 6.5–8.1)

## 2018-05-21 LAB — CBC WITH DIFFERENTIAL/PLATELET
Basophils Absolute: 0.1 10*3/uL (ref 0–0.1)
Basophils Relative: 1 %
EOS PCT: 2 %
Eosinophils Absolute: 0.1 10*3/uL (ref 0–0.7)
HCT: 42.5 % (ref 40.0–52.0)
HEMOGLOBIN: 13.8 g/dL (ref 13.0–18.0)
Lymphocytes Relative: 35 %
Lymphs Abs: 3 10*3/uL (ref 1.0–3.6)
MCH: 24.4 pg — ABNORMAL LOW (ref 26.0–34.0)
MCHC: 32.4 g/dL (ref 32.0–36.0)
MCV: 75.4 fL — AB (ref 80.0–100.0)
Monocytes Absolute: 0.6 10*3/uL (ref 0.2–1.0)
Monocytes Relative: 8 %
NEUTROS ABS: 4.7 10*3/uL (ref 1.4–6.5)
NEUTROS PCT: 54 %
PLATELETS: 184 10*3/uL (ref 150–440)
RBC: 5.64 MIL/uL (ref 4.40–5.90)
RDW: 15.2 % — ABNORMAL HIGH (ref 11.5–14.5)
WBC: 8.5 10*3/uL (ref 3.8–10.6)

## 2018-05-21 LAB — LIPASE, BLOOD: Lipase: 34 U/L (ref 11–51)

## 2018-05-21 MED ORDER — PANTOPRAZOLE SODIUM 40 MG PO TBEC: 40.0000 mg | DELAYED_RELEASE_TABLET | Freq: Every day | ORAL | 0 refills | Status: DC

## 2018-05-21 NOTE — ED Triage Notes (Signed)
Patient complains of abdominal pain that started on Saturday. Patient states that when he moves certain ways the pain radiates.

## 2018-05-21 NOTE — Discharge Instructions (Signed)
Protonix as prescribed.  Follow up closely with your PCP.  Take care  Dr. Lacinda Axon

## 2018-05-21 NOTE — ED Provider Notes (Signed)
[facility]    CSN: [id number] Arrival date & time: 05/21/18  0851  History   Chief Complaint Chief Complaint  Patient presents with  . Abdominal Pain   HPI  67 year old male presents with abdominal pain.  Patient reports that he developed upper abdominal pain on Saturday.  No associated nausea or vomiting.  No hematochezia or melena.  Denies chest pain or shortness of breath.  Patient states that he has a history of GERD.  He has been using Gas-X and Zantac without improvement.  Patient states that he has drank several beers this past weekend.  His pain sometimes radiates with certain ranges of motion.  No known exacerbating factors.  No other associated symptoms.  No other complaints  Past Medical History:  Diagnosis Date  . Erectile dysfunction   . GERD (gastroesophageal reflux disease)   . Hyperlipidemia   . Seasonal allergies    Past Surgical History:  Procedure Laterality Date  . COLONOSCOPY WITH PROPOFOL N/A 07/13/2015   Procedure: COLONOSCOPY WITH PROPOFOL;  Surgeon: Lollie Sails, MD;  Location: [facility] ENDOSCOPY;  Service: Endoscopy;  Laterality: N/A;   Home Medications    Prior to Admission medications   Medication Sig Start Date End Date Taking? Authorizing Provider  cholecalciferol (VITAMIN D) 400 UNITS TABS tablet Take 1,000 Units by mouth.   Yes [provider]  Multiple Vitamin (MULTIVITAMIN WITH MINERALS) TABS tablet Take 1 tablet by mouth daily.   Yes [provider]  omega-3 acid ethyl esters (LOVAZA) 1 G capsule Take 2 g by mouth daily.   Yes [provider]  tadalafil (CIALIS) 20 MG tablet Take 20 mg by mouth daily as needed for erectile dysfunction.   Yes [provider]  pantoprazole (PROTONIX) 40 MG tablet Take 1 tablet (40 mg total) by mouth daily. 05/21/18   Coral Spikes, DO   Family History Family History  Problem Relation Age of Onset  . Diabetes Father    Social History Social History   Tobacco  Use  . Smoking status: Current Some Day Smoker  . Smokeless tobacco: Never Used  Substance Use Topics  . Alcohol use: Yes    Comment: occasionally  . Drug use: No   Allergies   Ibuprofen  Review of Systems Review of Systems  Constitutional: Negative.   Gastrointestinal: Positive for abdominal pain.   Physical Exam Triage Vital Signs ED Triage Vitals  Enc Vitals Group     BP 05/21/18 0911 (!) 147/81     Pulse Rate 05/21/18 0911 (!) 59     Resp 05/21/18 0911 18     Temp 05/21/18 0911 98.1 F (36.7 C)     Temp Source 05/21/18 0911 Oral     SpO2 05/21/18 0911 99 %     Weight 05/21/18 0910 156 lb (70.8 kg)     Height 05/21/18 0910 5\' 6"  (1.676 m)     Head Circumference --      Peak Flow --      Pain Score 05/21/18 0909 8     Pain Loc --      Pain Edu? --      Excl. in [place]? --    No data found.  Updated Vital Signs BP (!) 147/81 (BP Location: Left Arm)   Pulse (!) 59   Temp 98.1 F (36.7 C) (Oral)   Resp 18   Ht 5\' 6"  (1.676 m)   Wt 156 lb (70.8 kg)   SpO2 99%  BMI 25.18 kg/m   Visual Acuity Right Eye Distance:   Left Eye Distance:   Bilateral Distance:    Right Eye Near:   Left Eye Near:    Bilateral Near:     Physical Exam  Constitutional: He is oriented to person, place, and time. He appears well-developed. No distress.  HENT:  Head: Normocephalic and atraumatic.  Cardiovascular: Regular rhythm.  Bradycardia.  Pulmonary/Chest: Effort normal and breath sounds normal. He has no wheezes. He has no rales.  Abdominal: Soft. He exhibits no distension and no mass. There is no rebound and no guarding.  Tender to palpation in the epigastric region.  Neurological: He is alert and oriented to person, place, and time.  Psychiatric: He has a normal mood and affect. His behavior is normal.  Nursing note and vitals reviewed.  UC Treatments / Results  Labs (all labs ordered are listed, but only abnormal results are displayed) Labs Reviewed  COMPREHENSIVE  METABOLIC PANEL - Abnormal; Notable for the following components:      Result Value   CO2 21 (*)    Glucose, Bld 108 (*)    All other components within normal limits  CBC WITH DIFFERENTIAL/PLATELET - Abnormal; Notable for the following components:   MCV 75.4 (*)    MCH 24.4 (*)    RDW 15.2 (*)    All other components within normal limits  LIPASE, BLOOD    EKG Interpretation: Sinus bradycardia at the rate of 53.  No ST or T wave changes.  Normal intervals.  Normal EKG.   Radiology No results found.  Procedures Procedures (including critical care time)  Medications Ordered in UC Medications - No data to display  Initial Impression / Assessment and Plan / UC Course  I have reviewed the triage vital signs and the nursing notes.  Pertinent labs & imaging results that were available during my care of the patient were reviewed by me and considered in my medical decision making (see chart for details).    67 year old male presents with epigastric pain. Likely gastritis (from alcohol use).  Labs and EKG unremarkable.  Treating with Protonix.  Final Clinical Impressions(s) / UC Diagnoses   Final diagnoses:  Abdominal pain, epigastric     Discharge Instructions     Protonix as prescribed.  Follow up closely with your PCP.  Take care  Dr. Lacinda Axon     ED Prescriptions    Medication Sig Dispense Auth. Provider   pantoprazole (PROTONIX) 40 MG tablet Take 1 tablet (40 mg total) by mouth daily. 30 tablet Coral Spikes, DO     Controlled Substance Prescriptions [place] Controlled Substance Registry consulted? Not Applicable   Coral Spikes, DO 05/21/18 1018

## 2018-05-28 ENCOUNTER — Ambulatory Visit
Admission: EM | Admit: 2018-05-28 | Discharge: 2018-05-28 | Disposition: A | Discharge status: Home / Self Care | Payer: Medicare HMO | Attending: Emergency Medicine | Admitting: Emergency Medicine

## 2018-05-28 ENCOUNTER — Encounter: Payer: Self-pay | Admitting: *Deleted

## 2018-05-28 DIAGNOSIS — M7022 Olecranon bursitis, left elbow: Secondary | ICD-10-CM

## 2018-05-28 NOTE — ED Triage Notes (Signed)
Pt struck left elbow on object 1 week ago. Left elbow now edematous and painful.

## 2018-05-28 NOTE — Discharge Instructions (Addendum)
May take 2 over-the-counter Aleve twice a day as needed for swelling.  Wear the elbow sleeve, use ice for 15 to 20 minutes at a time.  Go immediately to the [facility] urgent care if it appears to get infected.

## 2018-05-28 NOTE — ED Provider Notes (Signed)
HPI  SUBJECTIVE:  Alejandro Fox is a 67 y.o. male who presents with 1 week of swelling of the left olecranon bursa.  He is not sure how long it has been swollen.  States he bumped his left elbow against something last week.  He reports mild tenderness with palpation only, there is no other pain at any other time.  No redness, increased temperature, no fever, body aches.  No limitation of motion of his elbow.  He tried wearing an elbow sleeve one day without improvement in symptoms.  Symptoms are worse when he accidentally hits his elbow against things.  He is a smoker.  No history of diabetes, hypertension, MRSA.  [id number], Chrissie Noa, MD     Past Medical History:  Diagnosis Date  . Erectile dysfunction   . GERD (gastroesophageal reflux disease)   . Hyperlipidemia   . Seasonal allergies     Past Surgical History:  Procedure Laterality Date  . COLONOSCOPY WITH PROPOFOL N/A 07/13/2015   Procedure: COLONOSCOPY WITH PROPOFOL;  Surgeon: Lollie Sails, MD;  Location: [facility] ENDOSCOPY;  Service: Endoscopy;  Laterality: N/A;    Family History  Problem Relation Age of Onset  . Other Mother   . Diabetes Father   . Hypertension Father     Social History   Tobacco Use  . Smoking status: Current Some Day Smoker  . Smokeless tobacco: Never Used  Substance Use Topics  . Alcohol use: Yes    Comment: occasionally  . Drug use: No    No current facility-administered medications for this encounter.   Current Outpatient Medications:  .  cholecalciferol (VITAMIN D) 400 UNITS TABS tablet, Take 1,000 Units by mouth., Disp: , Rfl:  .  Multiple Vitamin (MULTIVITAMIN WITH MINERALS) TABS tablet, Take 1 tablet by mouth daily., Disp: , Rfl:  .  omega-3 acid ethyl esters (LOVAZA) 1 G capsule, Take 2 g by mouth daily., Disp: , Rfl:  .  pantoprazole (PROTONIX) 40 MG tablet, Take 1 tablet (40 mg total) by mouth daily., Disp: 30 tablet, Rfl: 0 .  tadalafil (CIALIS) 20 MG tablet, Take 20 mg by mouth  daily as needed for erectile dysfunction., Disp: , Rfl:   Allergies  Allergen Reactions  . Ibuprofen Other (See Comments)     ROS  As noted in HPI.   Physical Exam  BP (!) 151/78 (BP Location: Left Arm)   Pulse (!) 57   Temp 98.4 F (36.9 C) (Oral)   Resp 16   Ht 5\' 6"  (1.676 m)   Wt 152 lb (68.9 kg)   SpO2 98%   BMI 24.53 kg/m   Constitutional: Well developed, well nourished, no acute distress Eyes:  EOMI, conjunctiva normal bilaterally HENT: Normocephalic, atraumatic,mucus membranes moist Respiratory: Normal inspiratory effort Cardiovascular: Normal rate GI: nondistended skin: No rash, skin intact Musculoskeletal: Nontender, swollen bursae olecranon left elbow.  No increased erythema, temperature.  No signs of infection.  Left Elbow ROM Normal for Pt, NTentire joint, Supracondylar region NT, Radial head NT, Olecrenon process NT, Medial epicondyle NT, Lateral epicondyle NT, radial pulse intact, Sensation LT and Motor intact distally in distribution of radial, median, and ulnar nerve function. Neurologic: Alert & oriented x 3, no focal neuro deficits Psychiatric: Speech and behavior appropriate   ED Course   Medications - No data to display  No orders of the defined types were placed in this encounter.   No results found for this or any previous visit (from the past 24 hour(s)). No  results found.  ED Clinical Impression  Olecranon bursitis of left elbow   ED Assessment/Plan  Patient with a traumatic left olecranon bursitis.  There does not appear to be any infection at this point in time.  We will have him continue elbow sleeve, he states that he can take over-the-counter Aleve as needed for swelling.  He reports no pain.  Will refer him to [facility] if not better in 5 days to a week.  Advised him to go immediately to [facility] for any signs of infection.  Imaging not done as he had no bony tenderness.  Discussed  MDM, treatment plan, and plan for  follow-up with patient. patient agrees with plan.   No orders of the defined types were placed in this encounter.   *This clinic note was created using Dragon dictation software. Therefore, there may be occasional mistakes despite careful proofreading.   ?   Melynda Ripple, MD 05/30/18 [phone]

## 2018-06-04 DIAGNOSIS — M7022 Olecranon bursitis, left elbow: Secondary | ICD-10-CM | POA: Diagnosis not present

## 2018-06-06 DIAGNOSIS — N529 Male erectile dysfunction, unspecified: Secondary | ICD-10-CM | POA: Diagnosis not present

## 2018-06-06 DIAGNOSIS — Z0000 Encounter for general adult medical examination without abnormal findings: Secondary | ICD-10-CM | POA: Diagnosis not present

## 2018-06-06 DIAGNOSIS — K219 Gastro-esophageal reflux disease without esophagitis: Secondary | ICD-10-CM | POA: Diagnosis not present

## 2018-06-06 DIAGNOSIS — Z125 Encounter for screening for malignant neoplasm of prostate: Secondary | ICD-10-CM | POA: Diagnosis not present

## 2018-06-06 DIAGNOSIS — E7800 Pure hypercholesterolemia, unspecified: Secondary | ICD-10-CM | POA: Diagnosis not present

## 2018-06-06 DIAGNOSIS — E119 Type 2 diabetes mellitus without complications: Secondary | ICD-10-CM | POA: Diagnosis not present

## 2018-06-06 DIAGNOSIS — N200 Calculus of kidney: Secondary | ICD-10-CM | POA: Diagnosis not present

## 2018-06-06 DIAGNOSIS — M2550 Pain in unspecified joint: Secondary | ICD-10-CM | POA: Diagnosis not present

## 2018-06-06 DIAGNOSIS — I700 Atherosclerosis of aorta: Secondary | ICD-10-CM | POA: Diagnosis not present

## 2018-06-12 ENCOUNTER — Other Ambulatory Visit: Payer: Self-pay | Admitting: Family Medicine

## 2018-06-12 DIAGNOSIS — H401131 Primary open-angle glaucoma, bilateral, mild stage: Secondary | ICD-10-CM | POA: Diagnosis not present

## 2018-06-12 DIAGNOSIS — H40019 Open angle with borderline findings, low risk, unspecified eye: Secondary | ICD-10-CM | POA: Diagnosis not present

## 2018-06-12 DIAGNOSIS — H2513 Age-related nuclear cataract, bilateral: Secondary | ICD-10-CM | POA: Diagnosis not present

## 2018-07-14 ENCOUNTER — Other Ambulatory Visit: Payer: Self-pay | Admitting: Family Medicine

## 2018-09-07 ENCOUNTER — Ambulatory Visit: Payer: Self-pay | Admitting: Urology

## 2018-09-14 ENCOUNTER — Encounter: Payer: Self-pay | Admitting: Urology

## 2018-09-14 ENCOUNTER — Other Ambulatory Visit
Admission: RE | Admit: 2018-09-14 | Discharge: 2018-09-14 | Disposition: A | Discharge status: Home / Self Care | Payer: Medicare HMO | Source: Ambulatory Visit | Attending: Urology | Admitting: Urology

## 2018-09-14 ENCOUNTER — Ambulatory Visit (INDEPENDENT_AMBULATORY_CARE_PROVIDER_SITE_OTHER): Payer: Medicare HMO | Admitting: Urology

## 2018-09-14 VITALS — BP 168/74 | HR 62 | Ht 66.0 in | Wt 155.0 lb

## 2018-09-14 DIAGNOSIS — R972 Elevated prostate specific antigen [PSA]: Secondary | ICD-10-CM | POA: Diagnosis not present

## 2018-09-14 DIAGNOSIS — N529 Male erectile dysfunction, unspecified: Secondary | ICD-10-CM | POA: Insufficient documentation

## 2018-09-14 DIAGNOSIS — E785 Hyperlipidemia, unspecified: Secondary | ICD-10-CM | POA: Insufficient documentation

## 2018-09-14 DIAGNOSIS — M76899 Other specified enthesopathies of unspecified lower limb, excluding foot: Secondary | ICD-10-CM | POA: Insufficient documentation

## 2018-09-14 LAB — URINALYSIS, COMPLETE (UACMP) WITH MICROSCOPIC
Bacteria, UA: NONE SEEN
Bilirubin Urine: NEGATIVE
Glucose, UA: NEGATIVE mg/dL
KETONES UR: NEGATIVE mg/dL
Leukocytes, UA: NEGATIVE
Nitrite: NEGATIVE
Protein, ur: NEGATIVE mg/dL
SQUAMOUS EPITHELIAL / LPF: NONE SEEN (ref 0–5)
Specific Gravity, Urine: 1.025 (ref 1.005–1.030)
WBC, UA: NONE SEEN WBC/hpf (ref 0–5)
pH: 5.5 (ref 5.0–8.0)

## 2018-09-14 LAB — PSA: Prostatic Specific Antigen: 1.6 ng/mL (ref 0.00–4.00)

## 2018-09-14 NOTE — Progress Notes (Signed)
09/14/2018 11:40 AM   Alejandro Fox 1951-11-11 [id number]  Referring provider: Sofie Hartigan, MD [address] [place], [place] [address]  Chief Complaint  Patient presents with  . Elevated PSA    New patient    HPI: 67 yo M referred for elevated PSA to 5.28 as part of his routine annual labs.  Previous PSA trend as below.  He is never been notified of elevated PSA in the past.  He has no family history of prostate cancer.  No weight loss or bone pain.  No dysuria or gross hematuria.  No history of UTIs.  He denies any personal history of urinary symptoms.  He occasionally gets up at night to void but otherwise has no frequency, urgency, dysuria, weak stream or sensation of incomplete bladder emptying.  He is overall very pleased with his voiding symptoms.   PSA trend: 5.28  06/2018 1.63 05/2017 1.09 05/2015 1.04 05/2014   PMH: Past Medical History:  Diagnosis Date  . Erectile dysfunction   . GERD (gastroesophageal reflux disease)   . Hyperlipidemia   . Seasonal allergies     Surgical History: Past Surgical History:  Procedure Laterality Date  . COLONOSCOPY WITH PROPOFOL N/A 07/13/2015   Procedure: COLONOSCOPY WITH PROPOFOL;  Surgeon: Lollie Sails, MD;  Location: [facility] ENDOSCOPY;  Service: Endoscopy;  Laterality: N/A;    Home Medications:  Allergies as of 09/14/2018      Reactions   Ibuprofen Other (See Comments)      Medication List        Accurate as of 09/14/18 11:40 AM. Always use your most recent med list.          cholecalciferol 400 units Tabs tablet Commonly known as:  VITAMIN D Take 1,000 Units by mouth.   multivitamin with minerals Tabs tablet Take 1 tablet by mouth daily.   omega-3 acid ethyl esters 1 g capsule Commonly known as:  LOVAZA Take 2 g by mouth daily.   pantoprazole 40 MG tablet Commonly known as:  PROTONIX Take 1 tablet (40 mg total) by mouth daily.   tadalafil 20 MG tablet Commonly known as:  ADCIRCA/CIALIS Take  20 mg by mouth daily as needed for erectile dysfunction.       Allergies:  Allergies  Allergen Reactions  . Ibuprofen Other (See Comments)    Family History: Family History  Problem Relation Age of Onset  . Other Mother   . Diabetes Father   . Hypertension Father     Social History:  reports that he has been smoking. He has never used smokeless tobacco. He reports that he drinks alcohol. He reports that he does not use drugs.  ROS: UROLOGY Frequent Urination?: No Hard to postpone urination?: No Burning/pain with urination?: No Get up at night to urinate?: Yes Leakage of urine?: No Urine stream starts and stops?: No Trouble starting stream?: No Do you have to strain to urinate?: No Blood in urine?: No Urinary tract infection?: No Sexually transmitted disease?: No Injury to kidneys or bladder?: No Painful intercourse?: No Weak stream?: No Erection problems?: Yes Penile pain?: No  Gastrointestinal Nausea?: No Vomiting?: No Indigestion/heartburn?: Yes Diarrhea?: No Constipation?: No  Constitutional Fever: No Night sweats?: No Weight loss?: No Fatigue?: No  Skin Skin rash/lesions?: No Itching?: No  Eyes Blurred vision?: No Double vision?: No  Ears/Nose/Throat Sore throat?: No Sinus problems?: Yes  Hematologic/Lymphatic Swollen glands?: No Easy bruising?: No  Cardiovascular Leg swelling?: No Chest pain?: No  Respiratory  Cough?: Yes Shortness of breath?: No  Endocrine Excessive thirst?: No  Musculoskeletal Back pain?: Yes Joint pain?: Yes  Neurological Headaches?: No Dizziness?: No  Psychologic Depression?: No Anxiety?: No  Physical Exam: BP (!) 168/74   Pulse 62   Ht 5\' 6"  (1.676 m)   Wt 155 lb (70.3 kg)   BMI 25.02 kg/m   Constitutional:  Alert and oriented, No acute distress. HEENT: [place] AT, moist mucus membranes.  Trachea midline, no masses. Cardiovascular: No clubbing, cyanosis, or edema. Respiratory: Normal respiratory  effort, no increased work of breathing. GI: Abdomen is soft, nontender, nondistended, no abdominal masses GU: No CVA tenderness Rectal: Normal sphincter tone.  50 cc prostate with prominent lateral ridges bilaterally, somewhat indurated but no discrete nodules.  Nontender. Skin: No rashes, bruises or suspicious lesions. Neurologic: Grossly intact, no focal deficits, moving all 4 extremities. Psychiatric: Normal mood and affect.  Laboratory Data: Lab Results  Component Value Date   WBC 8.5 05/21/2018   HGB 13.8 05/21/2018   HCT 42.5 05/21/2018   MCV 75.4 (L) 05/21/2018   PLT 184 05/21/2018    Lab Results  Component Value Date   CREATININE 0.92 05/21/2018    Urinalysis Component     Latest Ref Rng & Units 09/14/2018  Color, Urine     YELLOW YELLOW  Appearance     CLEAR CLEAR  Specific Gravity, Urine     1.005 - 1.030 1.025  pH     5.0 - 8.0 5.5  Glucose, UA     NEGATIVE mg/dL NEGATIVE  Hgb urine dipstick     NEGATIVE TRACE (A)  Bilirubin Urine     NEGATIVE NEGATIVE  Ketones, ur     NEGATIVE mg/dL NEGATIVE  Protein     NEGATIVE mg/dL NEGATIVE  Nitrite     NEGATIVE NEGATIVE  Leukocytes, UA     NEGATIVE NEGATIVE  Squamous Epithelial / LPF     0 - 5 NONE SEEN  WBC, UA     0 - 5 WBC/hpf NONE SEEN  RBC / HPF     0 - 5 RBC/hpf 0-5  Bacteria, UA     NONE SEEN NONE SEEN   Pertinent Imaging: NA  Assessment & Plan:    1. Elevated PSA  We reviewed the implications of an elevated PSA and the uncertainty surrounding it. In general, a man's PSA increases with age and is produced by both normal and cancerous prostate tissue. Differential for elevated PSA is BPH, prostate cancer, infection, recent intercourse/ejaculation, prostate infarction, recent urethroscopic manipulation (foley placement/cystoscopy) and prostatitis. Management of an elevated PSA can include observation or prostate biopsy and wediscussed this in detail. We discussed that indications for prostate  biopsy are defined by age and race specific PSA cutoffs as well as a PSA velocity of 0.75/year.  No evidence of UTI today.    PSA was repeated today.  Depending on these results, we will either plan for close follow-up, or pursue prostate biopsy.  If his PSA normalizes, he can return back to his primary care physician.  We did briefly discuss prostate biopsy today. We discussed prostate biopsy in detail including the procedure itself, the risks of blood in the urine, stool, and ejaculate, serious infection, and discomfort. - PSA; Future - Urinalysis, Complete w Microscopic; Future   Return for will call with PSA results and plan.  Hollice Espy, MD  [facility] [address], [place],  [address] [phone]

## 2018-09-18 ENCOUNTER — Telehealth: Payer: Self-pay | Admitting: Family Medicine

## 2018-09-18 NOTE — Telephone Encounter (Signed)
Patient notified

## 2018-09-18 NOTE — Telephone Encounter (Signed)
-----   Message from Hollice Espy, MD sent at 09/15/2018  9:04 AM EDT ----- PSA is back down to baseline, 1.60. No further evaluation or treatment needed.  You may f/u with your PCP.  Hollice Espy, MD

## 2018-09-21 ENCOUNTER — Ambulatory Visit: Payer: Self-pay | Admitting: Urology

## 2018-10-15 DIAGNOSIS — Z86010 Personal history of colonic polyps: Secondary | ICD-10-CM | POA: Diagnosis not present

## 2018-10-15 DIAGNOSIS — K219 Gastro-esophageal reflux disease without esophagitis: Secondary | ICD-10-CM | POA: Diagnosis not present

## 2018-11-15 ENCOUNTER — Encounter: Payer: Self-pay | Admitting: Emergency Medicine

## 2018-11-15 ENCOUNTER — Ambulatory Visit
Admission: EM | Admit: 2018-11-15 | Discharge: 2018-11-15 | Disposition: A | Discharge status: Home / Self Care | Payer: Medicare HMO | Attending: Family Medicine | Admitting: Family Medicine

## 2018-11-15 ENCOUNTER — Other Ambulatory Visit: Payer: Self-pay

## 2018-11-15 DIAGNOSIS — J209 Acute bronchitis, unspecified: Secondary | ICD-10-CM | POA: Insufficient documentation

## 2018-11-15 MED ORDER — PREDNISONE 50 MG PO TABS: ORAL_TABLET | ORAL | 0 refills | Status: DC

## 2018-11-15 MED ORDER — HYDROCOD POLST-CPM POLST ER 10-8 MG/5ML PO SUER: 5.0000 mL | Freq: Every evening | ORAL | 0 refills | Status: DC | PRN

## 2018-11-15 NOTE — Discharge Instructions (Signed)
Medications as prescribed.  Call if you fail to improve or worsen let us know.  Take care  Dr. Lacinda Axon

## 2018-11-15 NOTE — ED Provider Notes (Signed)
[facility]    CSN: [id number] Arrival date & time: 11/15/18  1194  History   Chief Complaint Chief Complaint  Patient presents with  . Cough   HPI  67 year old male presents with cough.  Patient reports cough and associated nasal congestion for the past week.  His cough is mildly productive of discolored sputum.  No fever.  Cough is worse at night.  He has been using Alka-Seltzer plus and Mucinex without resolution.  No known relieving factors.  Symptoms are quite bothersome.  No other associated symptoms.  No other complaints.  PMH, Surgical Hx, Family Hx, Social History reviewed and updated as below.  Past Medical History:  Diagnosis Date  . Erectile dysfunction   . GERD (gastroesophageal reflux disease)   . Hyperlipidemia   . Seasonal allergies     Patient Active Problem List   Diagnosis Date Noted  . Enthesopathy of hip region 09/14/2018  . Erectile dysfunction 09/14/2018  . Hyperlipidemia 09/14/2018  . Seasonal allergic rhinitis due to pollen 01/27/2017  . Atherosclerosis of abdominal aorta ([place]) 11/01/2016  . GERD (gastroesophageal reflux disease) 05/08/2014    Past Surgical History:  Procedure Laterality Date  . COLONOSCOPY WITH PROPOFOL N/A 07/13/2015   Procedure: COLONOSCOPY WITH PROPOFOL;  Surgeon: Lollie Sails, MD;  Location: [facility] ENDOSCOPY;  Service: Endoscopy;  Laterality: N/A;       Home Medications    Prior to Admission medications   Medication Sig Start Date End Date Taking? Authorizing Provider  Multiple Vitamin (MULTIVITAMIN WITH MINERALS) TABS tablet Take 1 tablet by mouth daily.   Yes [provider]  omega-3 acid ethyl esters (LOVAZA) 1 G capsule Take 2 g by mouth daily.   Yes [provider]  pantoprazole (PROTONIX) 40 MG tablet Take 1 tablet (40 mg total) by mouth daily. 05/21/18  Yes Jt Brabec G, DO  tadalafil (CIALIS) 20 MG tablet Take 20 mg by mouth daily as needed for erectile dysfunction.   Yes  [provider]  chlorpheniramine-HYDROcodone (TUSSIONEX PENNKINETIC ER) 10-8 MG/5ML SUER Take 5 mLs by mouth at bedtime as needed. 11/15/18   Coral Spikes, DO  cholecalciferol (VITAMIN D) 400 UNITS TABS tablet Take 1,000 Units by mouth.    [provider]  predniSONE (DELTASONE) 50 MG tablet 1 tablet daily x 5 days 11/15/18   Coral Spikes, DO    Family History Family History  Problem Relation Age of Onset  . Other Mother   . Diabetes Father   . Hypertension Father     Social History Social History   Tobacco Use  . Smoking status: Current Some Day Smoker  . Smokeless tobacco: Never Used  Substance Use Topics  . Alcohol use: Yes    Comment: occasionally  . Drug use: No     Allergies   Ibuprofen   Review of Systems Review of Systems  Constitutional: Negative for fever.  HENT: Positive for congestion.   Respiratory: Positive for cough.    Physical Exam Triage Vital Signs ED Triage Vitals  Enc Vitals Group     BP 11/15/18 0938 (!) 158/89     Pulse Rate 11/15/18 0938 66     Resp 11/15/18 0938 18     Temp 11/15/18 0938 98.2 F (36.8 C)     Temp Source 11/15/18 0938 Oral     SpO2 11/15/18 0938 100 %     Weight 11/15/18 0939 155 lb (70.3 kg)     Height 11/15/18 0939 5\' 6"  (  1.676 m)     Head Circumference --      Peak Flow --      Pain Score 11/15/18 0939 0     Pain Loc --      Pain Edu? --      Excl. in [place]? --    Updated Vital Signs BP (!) 158/89 (BP Location: Left Arm)   Pulse 66   Temp 98.2 F (36.8 C) (Oral)   Resp 18   Ht 5\' 6"  (1.676 m)   Wt 70.3 kg   SpO2 100%   BMI 25.02 kg/m   Visual Acuity Right Eye Distance:   Left Eye Distance:   Bilateral Distance:    Right Eye Near:   Left Eye Near:    Bilateral Near:     Physical Exam Vitals signs and nursing note reviewed.  Constitutional:      Appearance: Normal appearance. He is not ill-appearing.  HENT:     Head: Normocephalic and atraumatic.     Nose: No rhinorrhea.      Mouth/Throat:     Pharynx: Oropharynx is clear. No posterior oropharyngeal erythema.  Neck:     Musculoskeletal: Neck supple.  Cardiovascular:     Rate and Rhythm: Normal rate and regular rhythm.  Pulmonary:     Effort: Pulmonary effort is normal.     Breath sounds: No wheezing, rhonchi or rales.  Lymphadenopathy:     Cervical: No cervical adenopathy.  Neurological:     Mental Status: He is alert.  Psychiatric:        Mood and Affect: Mood normal.        Behavior: Behavior normal.    UC Treatments / Results  Labs (all labs ordered are listed, but only abnormal results are displayed) Labs Reviewed - No data to display  EKG None  Radiology No results found.  Procedures Procedures (including critical care time)  Medications Ordered in UC Medications - No data to display  Initial Impression / Assessment and Plan / UC Course  I have reviewed the triage vital signs and the nursing notes.  Pertinent labs & imaging results that were available during my care of the patient were reviewed by me and considered in my medical decision making (see chart for details).  67 year old male presents with acute bronchitis.  Treating with prednisone and Tussionex.  Supportive care.  Final Clinical Impressions(s) / UC Diagnoses   Final diagnoses:  Acute bronchitis, unspecified organism     Discharge Instructions     Medications as prescribed.  Call if you fail to improve or worsen let us know.  Take care  Dr. Lacinda Axon    ED Prescriptions    Medication Sig Dispense Auth. Provider   predniSONE (DELTASONE) 50 MG tablet 1 tablet daily x 5 days 5 tablet Ksean Vale G, DO   chlorpheniramine-HYDROcodone (TUSSIONEX PENNKINETIC ER) 10-8 MG/5ML SUER Take 5 mLs by mouth at bedtime as needed. 60 mL Coral Spikes, DO     Controlled Substance Prescriptions [place] Controlled Substance Registry consulted? Not Applicable   Coral Spikes, DO 11/15/18 1051

## 2018-11-15 NOTE — ED Triage Notes (Signed)
Patient c/o cough and nasal congestion that started 1 week ago. Patient states he has been taking OTC alka seltzer with no relief. Denies fever.

## 2018-12-07 DIAGNOSIS — N200 Calculus of kidney: Secondary | ICD-10-CM | POA: Diagnosis not present

## 2018-12-07 DIAGNOSIS — N529 Male erectile dysfunction, unspecified: Secondary | ICD-10-CM | POA: Diagnosis not present

## 2018-12-07 DIAGNOSIS — E7800 Pure hypercholesterolemia, unspecified: Secondary | ICD-10-CM | POA: Diagnosis not present

## 2018-12-07 DIAGNOSIS — K219 Gastro-esophageal reflux disease without esophagitis: Secondary | ICD-10-CM | POA: Diagnosis not present

## 2018-12-07 DIAGNOSIS — E119 Type 2 diabetes mellitus without complications: Secondary | ICD-10-CM | POA: Diagnosis not present

## 2018-12-07 DIAGNOSIS — M2550 Pain in unspecified joint: Secondary | ICD-10-CM | POA: Diagnosis not present

## 2018-12-07 DIAGNOSIS — I700 Atherosclerosis of aorta: Secondary | ICD-10-CM | POA: Diagnosis not present

## 2019-01-15 ENCOUNTER — Encounter
Admission: RE | Disposition: A | Discharge status: Home / Self Care | Payer: Self-pay | Source: Ambulatory Visit | Attending: Gastroenterology

## 2019-01-15 ENCOUNTER — Ambulatory Visit: Payer: Medicare HMO | Admitting: Certified Registered"

## 2019-01-15 ENCOUNTER — Encounter: Payer: Self-pay | Admitting: Student

## 2019-01-15 ENCOUNTER — Ambulatory Visit
Admission: RE | Admit: 2019-01-15 | Discharge: 2019-01-15 | Disposition: A | Discharge status: Home / Self Care | Payer: Medicare HMO | Source: Ambulatory Visit | Attending: Gastroenterology | Admitting: Gastroenterology

## 2019-01-15 ENCOUNTER — Other Ambulatory Visit: Payer: Self-pay

## 2019-01-15 DIAGNOSIS — K5730 Diverticulosis of large intestine without perforation or abscess without bleeding: Secondary | ICD-10-CM | POA: Insufficient documentation

## 2019-01-15 DIAGNOSIS — K635 Polyp of colon: Secondary | ICD-10-CM | POA: Diagnosis not present

## 2019-01-15 DIAGNOSIS — Z79899 Other long term (current) drug therapy: Secondary | ICD-10-CM | POA: Insufficient documentation

## 2019-01-15 DIAGNOSIS — E785 Hyperlipidemia, unspecified: Secondary | ICD-10-CM | POA: Diagnosis not present

## 2019-01-15 DIAGNOSIS — N529 Male erectile dysfunction, unspecified: Secondary | ICD-10-CM | POA: Diagnosis not present

## 2019-01-15 DIAGNOSIS — K641 Second degree hemorrhoids: Secondary | ICD-10-CM | POA: Diagnosis not present

## 2019-01-15 DIAGNOSIS — K5520 Angiodysplasia of colon without hemorrhage: Secondary | ICD-10-CM | POA: Diagnosis not present

## 2019-01-15 DIAGNOSIS — F17200 Nicotine dependence, unspecified, uncomplicated: Secondary | ICD-10-CM | POA: Diagnosis not present

## 2019-01-15 DIAGNOSIS — D125 Benign neoplasm of sigmoid colon: Secondary | ICD-10-CM | POA: Diagnosis not present

## 2019-01-15 DIAGNOSIS — K648 Other hemorrhoids: Secondary | ICD-10-CM | POA: Diagnosis not present

## 2019-01-15 DIAGNOSIS — K219 Gastro-esophageal reflux disease without esophagitis: Secondary | ICD-10-CM | POA: Insufficient documentation

## 2019-01-15 DIAGNOSIS — Z86010 Personal history of colonic polyps: Secondary | ICD-10-CM | POA: Insufficient documentation

## 2019-01-15 DIAGNOSIS — Z1211 Encounter for screening for malignant neoplasm of colon: Secondary | ICD-10-CM | POA: Diagnosis not present

## 2019-01-15 HISTORY — PX: COLONOSCOPY WITH PROPOFOL: SHX5780

## 2019-01-15 SURGERY — COLONOSCOPY WITH PROPOFOL
Anesthesia: General

## 2019-01-15 MED ORDER — PROPOFOL 10 MG/ML IV BOLUS
INTRAVENOUS | Status: DC | PRN
  Administered 2019-01-15: 50 mg via INTRAVENOUS

## 2019-01-15 MED ORDER — SODIUM CHLORIDE 0.9 % IV SOLN
INTRAVENOUS | Status: DC
  Administered 2019-01-15: 1000 mL via INTRAVENOUS

## 2019-01-15 MED ORDER — EPHEDRINE SULFATE 50 MG/ML IJ SOLN
INTRAMUSCULAR | Status: DC | PRN
  Administered 2019-01-15: 5 mg via INTRAVENOUS

## 2019-01-15 MED ORDER — PROPOFOL 500 MG/50ML IV EMUL
INTRAVENOUS | Status: AC
  Filled 2019-01-15: qty 50

## 2019-01-15 MED ORDER — EPHEDRINE SULFATE 50 MG/ML IJ SOLN
INTRAMUSCULAR | Status: AC
Start: 2019-01-15 — End: ?
  Filled 2019-01-15: qty 1

## 2019-01-15 MED ORDER — LIDOCAINE HCL (CARDIAC) PF 100 MG/5ML IV SOSY
PREFILLED_SYRINGE | INTRAVENOUS | Status: DC | PRN
  Administered 2019-01-15: 60 mg via INTRATRACHEAL

## 2019-01-15 MED ORDER — LIDOCAINE HCL (PF) 2 % IJ SOLN
INTRAMUSCULAR | Status: AC
  Filled 2019-01-15: qty 10

## 2019-01-15 MED ORDER — PROPOFOL 500 MG/50ML IV EMUL
INTRAVENOUS | Status: DC | PRN
  Administered 2019-01-15: 100 ug/kg/min via INTRAVENOUS

## 2019-01-15 MED ORDER — FENTANYL CITRATE (PF) 100 MCG/2ML IJ SOLN
INTRAMUSCULAR | Status: DC | PRN
  Administered 2019-01-15: 50 ug via INTRAVENOUS

## 2019-01-15 MED ORDER — PROMETHAZINE HCL 25 MG/ML IJ SOLN
12.5000 mg | Freq: Once | INTRAMUSCULAR | Status: AC
  Administered 2019-01-15: 12.5 mg via INTRAVENOUS

## 2019-01-15 MED ORDER — FENTANYL CITRATE (PF) 100 MCG/2ML IJ SOLN
INTRAMUSCULAR | Status: AC
  Filled 2019-01-15: qty 2

## 2019-01-15 MED ORDER — PROMETHAZINE HCL 25 MG/ML IJ SOLN
INTRAMUSCULAR | Status: AC
  Filled 2019-01-15: qty 1

## 2019-01-15 NOTE — Anesthesia Preprocedure Evaluation (Signed)
Anesthesia Evaluation  Patient identified by MRN, date of birth, ID band Patient awake    Reviewed: Allergy & Precautions, NPO status , Patient's Chart, lab work & pertinent test results  History of Anesthesia Complications Negative for: history of anesthetic complications  Airway Mallampati: II  TM Distance: >3 FB Neck ROM: Full    Dental  (+) Partial Upper   Pulmonary neg sleep apnea, neg COPD, Current Smoker,    breath sounds clear to auscultation- rhonchi (-) wheezing      Cardiovascular Exercise Tolerance: Good (-) hypertension(-) CAD, (-) Past MI, (-) Cardiac Stents and (-) CABG  Rhythm:Regular Rate:Normal - Systolic murmurs and - Diastolic murmurs    Neuro/Psych neg Seizures negative neurological ROS  negative psych ROS   GI/Hepatic Neg liver ROS, GERD  ,  Endo/Other  negative endocrine ROSneg diabetes  Renal/GU negative Renal ROS     Musculoskeletal negative musculoskeletal ROS (+)   Abdominal (+) - obese,   Peds  Hematology negative hematology ROS (+)   Anesthesia Other Findings Past Medical History: No date: Erectile dysfunction No date: GERD (gastroesophageal reflux disease) No date: Hyperlipidemia No date: Seasonal allergies   Reproductive/Obstetrics                             Anesthesia Physical Anesthesia Plan  ASA: II  Anesthesia Plan: General   Post-op Pain Management:    Induction: Intravenous  PONV Risk Score and Plan: 0 and Propofol infusion  Airway Management Planned: Natural Airway  Additional Equipment:   Intra-op Plan:   Post-operative Plan:   Informed Consent: I have reviewed the patients History and Physical, chart, labs and discussed the procedure including the risks, benefits and alternatives for the proposed anesthesia with the patient or authorized representative who has indicated his/her understanding and acceptance.     Dental advisory  given  Plan Discussed with: CRNA and Anesthesiologist  Anesthesia Plan Comments:         Anesthesia Quick Evaluation

## 2019-01-15 NOTE — H&P (Signed)
Outpatient short stay form Pre-procedure 01/15/2019 2:37 PM Lollie Sails MD  Primary Physician: Dr. Thereasa Distance  Reason for visit: Colonoscopy  History of present illness: Patient is a 68 year old male presenting today for colonoscopy.  He has personal history of adenomatous colon polyps with his last colonoscopy being 07/13/2015.  Tolerated his prep well.  He takes no aspirin or blood thinning agent.    Current Facility-Administered Medications:  .  0.9 %  sodium chloride infusion, , Intravenous, Continuous, Lollie Sails, MD, Last Rate: 20 mL/hr at 01/15/19 1345  Medications Prior to Admission  Medication Sig Dispense Refill Last Dose  . cholecalciferol (VITAMIN D) 400 UNITS TABS tablet Take 1,000 Units by mouth.   Past Week at Unknown time  . Multiple Vitamin (MULTIVITAMIN WITH MINERALS) TABS tablet Take 1 tablet by mouth daily.   Past Week at Unknown time  . omega-3 acid ethyl esters (LOVAZA) 1 G capsule Take 2 g by mouth daily.   Past Week at Unknown time  . pantoprazole (PROTONIX) 40 MG tablet Take 1 tablet (40 mg total) by mouth daily. 30 tablet 0 Past Week at Unknown time  . tadalafil (CIALIS) 20 MG tablet Take 20 mg by mouth daily as needed for erectile dysfunction.   Past Month at Unknown time  . chlorpheniramine-HYDROcodone (TUSSIONEX PENNKINETIC ER) 10-8 MG/5ML SUER Take 5 mLs by mouth at bedtime as needed. (Patient not taking: Reported on 01/15/2019) 60 mL 0 Not Taking at Unknown time  . predniSONE (DELTASONE) 50 MG tablet 1 tablet daily x 5 days (Patient not taking: Reported on 01/15/2019) 5 tablet 0 Completed Course at Unknown time     Allergies  Allergen Reactions  . Ibuprofen Other (See Comments)     Past Medical History:  Diagnosis Date  . Erectile dysfunction   . GERD (gastroesophageal reflux disease)   . Hyperlipidemia   . Seasonal allergies     Review of systems:      Physical Exam    Heart and lungs: Rhythm without rub or gallop, lungs are  bilaterally clear.    HEENT: Normocephalic atraumatic eyes are anicteric    Other:    Pertinant exam for procedure: Soft nontender nondistended bowel sounds positive normoactive    Planned proceedures: Colonoscopy and indicated procedures. I have discussed the risks benefits and complications of procedures to include not limited to bleeding, infection, perforation and the risk of sedation and the patient wishes to proceed.    Lollie Sails, MD Gastroenterology 01/15/2019  2:37 PM

## 2019-01-15 NOTE — Progress Notes (Signed)
Phenergan 12.5 mg adm IV diluted in 10 ml NS given at left upper antecubital vein site, since his reg. IV was in right hand site.

## 2019-01-15 NOTE — Anesthesia Post-op Follow-up Note (Signed)
Anesthesia QCDR form completed.        

## 2019-01-15 NOTE — Transfer of Care (Signed)
Immediate Anesthesia Transfer of Care Note  Patient: Alejandro Fox  Procedure(s) Performed: COLONOSCOPY WITH PROPOFOL (N/A )  Patient Location: Endoscopy Unit  Anesthesia Type:General  Level of Consciousness: awake  Airway & Oxygen Therapy: Patient Spontanous Breathing and Patient connected to nasal cannula oxygen  Post-op Assessment: Report given to RN and Post -op Vital signs reviewed and stable  Post vital signs: stable  Last Vitals:  Vitals Value Taken Time  BP 140/84 01/15/2019  3:19 PM  Temp 36 C 01/15/2019  3:18 PM  Pulse 62 01/15/2019  3:21 PM  Resp 17 01/15/2019  3:21 PM  SpO2 100 % 01/15/2019  3:21 PM  Vitals shown include unvalidated device data.  Last Pain:  Vitals:   01/15/19 1518  TempSrc: Tympanic  PainSc: 0-No pain         Complications: No apparent anesthesia complications

## 2019-01-15 NOTE — Op Note (Addendum)
[facility] Patient Name: Alejandro Fox Procedure Date: 01/15/2019 2:33 PM MRN: [id number] Account #: [id number] Date of Birth: Apr 04, 1951 Admit Type: Outpatient Age: 68 Room: [facility] ENDO ROOM 2 Gender: Male Note Status: Finalized Procedure:            Colonoscopy Indications:          Personal history of colonic polyps Providers:            Lollie Sails, MD Referring MD:         Sofie Hartigan (Referring MD) Medicines:            Monitored Anesthesia Care Complications:        No immediate complications. Procedure:            Pre-Anesthesia Assessment:                       - ASA Grade Assessment: II - A patient with mild                        systemic disease.                       After obtaining informed consent, the colonoscope was                        passed under direct vision. Throughout the procedure,                        the patient's blood pressure, pulse, and oxygen                        saturations were monitored continuously. The                        Colonoscope was introduced through the anus and                        advanced to the the cecum, identified by appendiceal                        orifice and ileocecal valve. The colonoscopy was                        performed without difficulty. The patient tolerated the                        procedure well. The quality of the bowel preparation                        was good except the ascending colon was fair. Findings:      A 5 mm polyp was found in the mid sigmoid colon. The polyp was sessile.       The polyp was removed with a cold snare. Resection and retrieval were       complete.      Multiple small-mouthed diverticula were found in the sigmoid colon,       descending colon and splenic flexure.      Non-bleeding internal hemorrhoids were found during anoscopy. The       hemorrhoids were small and Grade II (internal hemorrhoids that prolapse  but reduce  spontaneously).      Two small to medium, located deep to the mucosa, withoutcontact or other       bleeding localized angioectasias without bleeding were found in the       cecum. Impression:           - One 5 mm polyp in the mid sigmoid colon, removed with                        a cold snare. Resected and retrieved.                       - Diverticulosis in the sigmoid colon, in the                        descending colon and at the splenic flexure.                       - Non-bleeding internal hemorrhoids. Recommendation:       - Discharge patient to home.                       - Telephone GI clinic for pathology results in 1 week. Procedure Code(s):    --- Professional ---                       [phone], Colonoscopy, flexible; with removal of tumor(s),                        polyp(s), or other lesion(s) by snare technique Diagnosis Code(s):    --- Professional ---                       D12.5, Benign neoplasm of sigmoid colon                       K64.1, Second degree hemorrhoids                       Z86.010, Personal history of colonic polyps                       K57.30, Diverticulosis of large intestine without                        perforation or abscess without bleeding CPT copyright 2018 American Medical Association. All rights reserved. The codes documented in this report are preliminary and upon coder review may  be revised to meet current compliance requirements. Lollie Sails, MD 01/15/2019 3:18:54 PM This report has been signed electronically. Number of Addenda: 0 Note Initiated On: 01/15/2019 2:33 PM Scope Withdrawal Time: 0 hours 14 minutes 17 seconds  Total Procedure Duration: 0 hours 24 minutes 43 seconds       [facility]

## 2019-01-16 ENCOUNTER — Encounter: Payer: Self-pay | Admitting: Gastroenterology

## 2019-01-16 NOTE — Anesthesia Postprocedure Evaluation (Signed)
Anesthesia Post Note  Patient: Alejandro Fox  Procedure(s) Performed: COLONOSCOPY WITH PROPOFOL (N/A )  Patient location during evaluation: Endoscopy Anesthesia Type: General Level of consciousness: awake and alert and oriented Pain management: pain level controlled Vital Signs Assessment: post-procedure vital signs reviewed and stable Respiratory status: spontaneous breathing, nonlabored ventilation and respiratory function stable Cardiovascular status: blood pressure returned to baseline and stable Postop Assessment: no signs of nausea or vomiting Anesthetic complications: no     Last Vitals:  Vitals:   01/15/19 1548 01/15/19 1605  BP:  (!) 166/85  Pulse: (!) 55   Resp: 12   Temp:    SpO2: 97%     Last Pain:  Vitals:   01/16/19 0745  TempSrc:   PainSc: 0-No pain                 Darcella Shiffman

## 2019-01-17 LAB — SURGICAL PATHOLOGY

## 2019-06-13 DIAGNOSIS — N200 Calculus of kidney: Secondary | ICD-10-CM | POA: Diagnosis not present

## 2019-06-13 DIAGNOSIS — N529 Male erectile dysfunction, unspecified: Secondary | ICD-10-CM | POA: Diagnosis not present

## 2019-06-13 DIAGNOSIS — M2550 Pain in unspecified joint: Secondary | ICD-10-CM | POA: Diagnosis not present

## 2019-06-13 DIAGNOSIS — K219 Gastro-esophageal reflux disease without esophagitis: Secondary | ICD-10-CM | POA: Diagnosis not present

## 2019-06-13 DIAGNOSIS — E7800 Pure hypercholesterolemia, unspecified: Secondary | ICD-10-CM | POA: Diagnosis not present

## 2019-06-13 DIAGNOSIS — M7062 Trochanteric bursitis, left hip: Secondary | ICD-10-CM | POA: Diagnosis not present

## 2019-06-13 DIAGNOSIS — Z125 Encounter for screening for malignant neoplasm of prostate: Secondary | ICD-10-CM | POA: Diagnosis not present

## 2019-06-13 DIAGNOSIS — Z0000 Encounter for general adult medical examination without abnormal findings: Secondary | ICD-10-CM | POA: Diagnosis not present

## 2019-06-13 DIAGNOSIS — I700 Atherosclerosis of aorta: Secondary | ICD-10-CM | POA: Diagnosis not present

## 2019-06-13 DIAGNOSIS — E119 Type 2 diabetes mellitus without complications: Secondary | ICD-10-CM | POA: Diagnosis not present

## 2019-11-07 ENCOUNTER — Other Ambulatory Visit: Payer: Self-pay

## 2019-11-07 ENCOUNTER — Ambulatory Visit
Admission: EM | Admit: 2019-11-07 | Discharge: 2019-11-07 | Disposition: A | Discharge status: Home / Self Care | Payer: Medicare HMO

## 2019-11-08 ENCOUNTER — Other Ambulatory Visit: Payer: Self-pay

## 2019-11-08 DIAGNOSIS — Z20822 Contact with and (suspected) exposure to covid-19: Secondary | ICD-10-CM

## 2019-11-09 LAB — NOVEL CORONAVIRUS, NAA: SARS-CoV-2, NAA: NOT DETECTED

## 2019-11-10 ENCOUNTER — Telehealth: Payer: Self-pay | Admitting: Family Medicine

## 2019-11-10 NOTE — Telephone Encounter (Signed)
Negative COVID results given. Patient results "NOT Detected." Caller expressed understanding. ° °

## 2019-12-16 DIAGNOSIS — M2550 Pain in unspecified joint: Secondary | ICD-10-CM | POA: Diagnosis not present

## 2019-12-16 DIAGNOSIS — F17200 Nicotine dependence, unspecified, uncomplicated: Secondary | ICD-10-CM | POA: Diagnosis not present

## 2019-12-16 DIAGNOSIS — K219 Gastro-esophageal reflux disease without esophagitis: Secondary | ICD-10-CM | POA: Diagnosis not present

## 2019-12-16 DIAGNOSIS — N529 Male erectile dysfunction, unspecified: Secondary | ICD-10-CM | POA: Diagnosis not present

## 2019-12-16 DIAGNOSIS — I700 Atherosclerosis of aorta: Secondary | ICD-10-CM | POA: Diagnosis not present

## 2019-12-16 DIAGNOSIS — N200 Calculus of kidney: Secondary | ICD-10-CM | POA: Diagnosis not present

## 2019-12-16 DIAGNOSIS — D649 Anemia, unspecified: Secondary | ICD-10-CM | POA: Diagnosis not present

## 2019-12-16 DIAGNOSIS — E119 Type 2 diabetes mellitus without complications: Secondary | ICD-10-CM | POA: Diagnosis not present

## 2019-12-16 DIAGNOSIS — E7800 Pure hypercholesterolemia, unspecified: Secondary | ICD-10-CM | POA: Diagnosis not present

## 2020-01-02 DIAGNOSIS — D72829 Elevated white blood cell count, unspecified: Secondary | ICD-10-CM | POA: Diagnosis not present

## 2020-02-18 DIAGNOSIS — D72829 Elevated white blood cell count, unspecified: Secondary | ICD-10-CM | POA: Diagnosis not present

## 2020-04-17 DIAGNOSIS — H40013 Open angle with borderline findings, low risk, bilateral: Secondary | ICD-10-CM | POA: Diagnosis not present

## 2020-04-17 DIAGNOSIS — H2513 Age-related nuclear cataract, bilateral: Secondary | ICD-10-CM | POA: Diagnosis not present

## 2020-04-17 DIAGNOSIS — H40019 Open angle with borderline findings, low risk, unspecified eye: Secondary | ICD-10-CM | POA: Diagnosis not present

## 2020-06-19 DIAGNOSIS — M2550 Pain in unspecified joint: Secondary | ICD-10-CM | POA: Diagnosis not present

## 2020-06-19 DIAGNOSIS — I700 Atherosclerosis of aorta: Secondary | ICD-10-CM | POA: Diagnosis not present

## 2020-06-19 DIAGNOSIS — Z0000 Encounter for general adult medical examination without abnormal findings: Secondary | ICD-10-CM | POA: Diagnosis not present

## 2020-06-19 DIAGNOSIS — K219 Gastro-esophageal reflux disease without esophagitis: Secondary | ICD-10-CM | POA: Diagnosis not present

## 2020-06-19 DIAGNOSIS — E7800 Pure hypercholesterolemia, unspecified: Secondary | ICD-10-CM | POA: Diagnosis not present

## 2020-06-19 DIAGNOSIS — E119 Type 2 diabetes mellitus without complications: Secondary | ICD-10-CM | POA: Diagnosis not present

## 2020-06-19 DIAGNOSIS — Z125 Encounter for screening for malignant neoplasm of prostate: Secondary | ICD-10-CM | POA: Diagnosis not present

## 2020-06-19 DIAGNOSIS — D649 Anemia, unspecified: Secondary | ICD-10-CM | POA: Diagnosis not present

## 2020-06-19 DIAGNOSIS — N529 Male erectile dysfunction, unspecified: Secondary | ICD-10-CM | POA: Diagnosis not present

## 2020-06-19 DIAGNOSIS — N200 Calculus of kidney: Secondary | ICD-10-CM | POA: Diagnosis not present

## 2020-09-07 DIAGNOSIS — Z20822 Contact with and (suspected) exposure to covid-19: Secondary | ICD-10-CM | POA: Diagnosis not present

## 2020-09-10 DIAGNOSIS — Z1152 Encounter for screening for COVID-19: Secondary | ICD-10-CM | POA: Diagnosis not present

## 2020-09-10 DIAGNOSIS — Z03818 Encounter for observation for suspected exposure to other biological agents ruled out: Secondary | ICD-10-CM | POA: Diagnosis not present

## 2020-10-22 DIAGNOSIS — J019 Acute sinusitis, unspecified: Secondary | ICD-10-CM | POA: Diagnosis not present

## 2020-11-30 DIAGNOSIS — Z1152 Encounter for screening for COVID-19: Secondary | ICD-10-CM | POA: Diagnosis not present

## 2020-11-30 DIAGNOSIS — Z03818 Encounter for observation for suspected exposure to other biological agents ruled out: Secondary | ICD-10-CM | POA: Diagnosis not present

## 2022-01-18 DIAGNOSIS — E119 Type 2 diabetes mellitus without complications: Secondary | ICD-10-CM | POA: Diagnosis not present

## 2022-01-18 DIAGNOSIS — M2550 Pain in unspecified joint: Secondary | ICD-10-CM | POA: Diagnosis not present

## 2022-01-18 DIAGNOSIS — I700 Atherosclerosis of aorta: Secondary | ICD-10-CM | POA: Diagnosis not present

## 2022-01-18 DIAGNOSIS — E7800 Pure hypercholesterolemia, unspecified: Secondary | ICD-10-CM | POA: Diagnosis not present

## 2022-01-18 DIAGNOSIS — K219 Gastro-esophageal reflux disease without esophagitis: Secondary | ICD-10-CM | POA: Diagnosis not present

## 2022-01-18 DIAGNOSIS — N529 Male erectile dysfunction, unspecified: Secondary | ICD-10-CM | POA: Diagnosis not present

## 2022-01-18 DIAGNOSIS — N200 Calculus of kidney: Secondary | ICD-10-CM | POA: Diagnosis not present

## 2022-01-18 DIAGNOSIS — D649 Anemia, unspecified: Secondary | ICD-10-CM | POA: Diagnosis not present

## 2022-01-22 DIAGNOSIS — E785 Hyperlipidemia, unspecified: Secondary | ICD-10-CM | POA: Diagnosis not present

## 2022-01-22 DIAGNOSIS — R030 Elevated blood-pressure reading, without diagnosis of hypertension: Secondary | ICD-10-CM | POA: Diagnosis not present

## 2022-01-22 DIAGNOSIS — Z720 Tobacco use: Secondary | ICD-10-CM | POA: Diagnosis not present

## 2022-01-22 DIAGNOSIS — N529 Male erectile dysfunction, unspecified: Secondary | ICD-10-CM | POA: Diagnosis not present

## 2022-06-02 DIAGNOSIS — H2513 Age-related nuclear cataract, bilateral: Secondary | ICD-10-CM | POA: Diagnosis not present

## 2022-06-02 DIAGNOSIS — H40019 Open angle with borderline findings, low risk, unspecified eye: Secondary | ICD-10-CM | POA: Diagnosis not present

## 2022-06-02 DIAGNOSIS — H40013 Open angle with borderline findings, low risk, bilateral: Secondary | ICD-10-CM | POA: Diagnosis not present

## 2022-08-22 DIAGNOSIS — H2513 Age-related nuclear cataract, bilateral: Secondary | ICD-10-CM | POA: Diagnosis not present

## 2022-08-22 DIAGNOSIS — H40013 Open angle with borderline findings, low risk, bilateral: Secondary | ICD-10-CM | POA: Diagnosis not present

## 2022-10-21 DIAGNOSIS — H524 Presbyopia: Secondary | ICD-10-CM | POA: Diagnosis not present

## 2023-01-18 DIAGNOSIS — H43813 Vitreous degeneration, bilateral: Secondary | ICD-10-CM | POA: Diagnosis not present

## 2023-01-18 DIAGNOSIS — Z0100 Encounter for examination of eyes and vision without abnormal findings: Secondary | ICD-10-CM | POA: Diagnosis not present

## 2023-01-18 DIAGNOSIS — H2513 Age-related nuclear cataract, bilateral: Secondary | ICD-10-CM | POA: Diagnosis not present

## 2023-01-18 DIAGNOSIS — H40013 Open angle with borderline findings, low risk, bilateral: Secondary | ICD-10-CM | POA: Diagnosis not present

## 2023-01-30 DIAGNOSIS — K219 Gastro-esophageal reflux disease without esophagitis: Secondary | ICD-10-CM | POA: Diagnosis not present

## 2023-01-30 DIAGNOSIS — I700 Atherosclerosis of aorta: Secondary | ICD-10-CM | POA: Diagnosis not present

## 2023-01-30 DIAGNOSIS — Z0000 Encounter for general adult medical examination without abnormal findings: Secondary | ICD-10-CM | POA: Diagnosis not present

## 2023-01-30 DIAGNOSIS — Z1331 Encounter for screening for depression: Secondary | ICD-10-CM | POA: Diagnosis not present

## 2023-01-30 DIAGNOSIS — N529 Male erectile dysfunction, unspecified: Secondary | ICD-10-CM | POA: Diagnosis not present

## 2023-01-30 DIAGNOSIS — M2550 Pain in unspecified joint: Secondary | ICD-10-CM | POA: Diagnosis not present

## 2023-01-30 DIAGNOSIS — N200 Calculus of kidney: Secondary | ICD-10-CM | POA: Diagnosis not present

## 2023-01-30 DIAGNOSIS — E119 Type 2 diabetes mellitus without complications: Secondary | ICD-10-CM | POA: Diagnosis not present

## 2023-01-30 DIAGNOSIS — E7800 Pure hypercholesterolemia, unspecified: Secondary | ICD-10-CM | POA: Diagnosis not present

## 2023-08-23 DIAGNOSIS — K219 Gastro-esophageal reflux disease without esophagitis: Secondary | ICD-10-CM | POA: Diagnosis not present

## 2023-08-23 DIAGNOSIS — M2550 Pain in unspecified joint: Secondary | ICD-10-CM | POA: Diagnosis not present

## 2023-08-23 DIAGNOSIS — Z0000 Encounter for general adult medical examination without abnormal findings: Secondary | ICD-10-CM | POA: Diagnosis not present

## 2023-08-23 DIAGNOSIS — N529 Male erectile dysfunction, unspecified: Secondary | ICD-10-CM | POA: Diagnosis not present

## 2023-08-23 DIAGNOSIS — E7800 Pure hypercholesterolemia, unspecified: Secondary | ICD-10-CM | POA: Diagnosis not present

## 2023-08-23 DIAGNOSIS — F17210 Nicotine dependence, cigarettes, uncomplicated: Secondary | ICD-10-CM | POA: Diagnosis not present

## 2023-08-23 DIAGNOSIS — I700 Atherosclerosis of aorta: Secondary | ICD-10-CM | POA: Diagnosis not present

## 2023-08-23 DIAGNOSIS — E119 Type 2 diabetes mellitus without complications: Secondary | ICD-10-CM | POA: Diagnosis not present

## 2023-08-23 DIAGNOSIS — N200 Calculus of kidney: Secondary | ICD-10-CM | POA: Diagnosis not present

## 2023-08-24 DIAGNOSIS — E119 Type 2 diabetes mellitus without complications: Secondary | ICD-10-CM | POA: Diagnosis not present

## 2023-08-24 DIAGNOSIS — Z125 Encounter for screening for malignant neoplasm of prostate: Secondary | ICD-10-CM | POA: Diagnosis not present

## 2023-10-11 ENCOUNTER — Ambulatory Visit
Admission: EM | Admit: 2023-10-11 | Discharge: 2023-10-11 | Disposition: A | Discharge status: Home / Self Care | Payer: Medicare HMO | Attending: Family Medicine | Admitting: Family Medicine

## 2023-10-11 ENCOUNTER — Ambulatory Visit: Payer: Medicare HMO

## 2023-10-11 DIAGNOSIS — N200 Calculus of kidney: Secondary | ICD-10-CM | POA: Diagnosis not present

## 2023-10-11 DIAGNOSIS — M719 Bursopathy, unspecified: Secondary | ICD-10-CM | POA: Insufficient documentation

## 2023-10-11 DIAGNOSIS — K5730 Diverticulosis of large intestine without perforation or abscess without bleeding: Secondary | ICD-10-CM | POA: Diagnosis not present

## 2023-10-11 DIAGNOSIS — R030 Elevated blood-pressure reading, without diagnosis of hypertension: Secondary | ICD-10-CM | POA: Insufficient documentation

## 2023-10-11 DIAGNOSIS — N400 Enlarged prostate without lower urinary tract symptoms: Secondary | ICD-10-CM | POA: Diagnosis not present

## 2023-10-11 DIAGNOSIS — R1013 Epigastric pain: Secondary | ICD-10-CM | POA: Insufficient documentation

## 2023-10-11 DIAGNOSIS — K859 Acute pancreatitis without necrosis or infection, unspecified: Secondary | ICD-10-CM | POA: Insufficient documentation

## 2023-10-11 DIAGNOSIS — K8689 Other specified diseases of pancreas: Secondary | ICD-10-CM | POA: Diagnosis not present

## 2023-10-11 LAB — COMPREHENSIVE METABOLIC PANEL
ALT: 21 U/L (ref 0–44)
AST: 21 U/L (ref 15–41)
Albumin: 4.4 g/dL (ref 3.5–5.0)
Alkaline Phosphatase: 52 U/L (ref 38–126)
Anion gap: 7 (ref 5–15)
BUN: 14 mg/dL (ref 8–23)
CO2: 24 mmol/L (ref 22–32)
Calcium: 9.5 mg/dL (ref 8.9–10.3)
Chloride: 108 mmol/L (ref 98–111)
Creatinine, Ser: 0.93 mg/dL (ref 0.61–1.24)
GFR, Estimated: >60 mL/min (ref >60)
Glucose, Bld: 107 mg/dL — ABNORMAL HIGH (ref 70–99)
Potassium: 3.7 mmol/L (ref 3.5–5.1)
Sodium: 139 mmol/L (ref 135–145)
Total Bilirubin: 0.5 mg/dL (ref <1.2)
Total Protein: 7.8 g/dL (ref 6.5–8.1)

## 2023-10-11 LAB — CBC WITH DIFFERENTIAL/PLATELET
Abs Immature Granulocytes: 0.05 10*3/uL (ref 0.00–0.07)
Basophils Absolute: 0.1 10*3/uL (ref 0.0–0.1)
Basophils Relative: 0 %
Eosinophils Absolute: 0.1 10*3/uL (ref 0.0–0.5)
Eosinophils Relative: 1 %
HCT: 41.9 % (ref 39.0–52.0)
Hemoglobin: 13.5 g/dL (ref 13.0–17.0)
Immature Granulocytes: 0 %
Lymphocytes Relative: 24 %
Lymphs Abs: 3 10*3/uL (ref 0.7–4.0)
MCH: 24.3 pg — ABNORMAL LOW (ref 26.0–34.0)
MCHC: 32.2 g/dL (ref 30.0–36.0)
MCV: 75.5 fL — ABNORMAL LOW (ref 80.0–100.0)
Monocytes Absolute: 1 10*3/uL (ref 0.1–1.0)
Monocytes Relative: 8 %
Neutro Abs: 8.2 10*3/uL — ABNORMAL HIGH (ref 1.7–7.7)
Neutrophils Relative %: 67 %
Platelets: 218 10*3/uL (ref 150–400)
RBC: 5.55 MIL/uL (ref 4.22–5.81)
RDW: 17.2 % — ABNORMAL HIGH (ref 11.5–15.5)
WBC: 12.4 10*3/uL — ABNORMAL HIGH (ref 4.0–10.5)
nRBC: 0 % (ref 0.0–0.2)

## 2023-10-11 LAB — LIPASE, BLOOD: Lipase: 294 U/L — ABNORMAL HIGH (ref 11–51)

## 2023-10-11 MED ORDER — PANTOPRAZOLE SODIUM 40 MG PO TBEC: 40.0000 mg | DELAYED_RELEASE_TABLET | Freq: Every day | ORAL | 0 refills | Status: AC

## 2023-10-11 MED ORDER — IOHEXOL 300 MG/ML  SOLN
100.0000 mL | Freq: Once | INTRAMUSCULAR | Status: AC | PRN
  Administered 2023-10-11: 100 mL via INTRAVENOUS

## 2023-10-11 MED ORDER — HYDROCODONE-ACETAMINOPHEN 5-325 MG PO TABS: 1.0000 | ORAL_TABLET | ORAL | 0 refills | Status: AC | PRN

## 2023-10-11 NOTE — ED Notes (Signed)
CT abdomen Pelvis W/contrast approval  PA approval # [id number] approved 11/6-12/10/2023  Tracking number [id number]

## 2023-10-11 NOTE — Discharge Instructions (Addendum)
You have pancreatitis.  Drink plenty of fluids. Do not drink any alcohol so that the pancreas is not  further inflamed.  Try to eat bland foods over the next couple of days.  If it hurts too much, do not eat at all.  I sent some pain medication to your pharmacy.  Stop by and pick it up.  Drive or operate heavy machinery while taking this medication. I also prescribed a medication for acid reflux.   Return to the urgent care or schedule an appointment with your primary care provider on Friday to have your lab work repeated.   There is also a 9 mm stone in your right kidney.

## 2023-10-11 NOTE — ED Triage Notes (Signed)
Pt c/o upper abd pain x1 day. States pain started after eating cabbage & cauliflower from 2 days ago.  Denies any N/V/D. Is able to keep foods & fluids down. Has tried gas x w/o relief.

## 2023-10-11 NOTE — ED Provider Notes (Signed)
[facility]    CSN: [id number] Arrival date & time: 10/11/23  1315      History   Chief Complaint Chief Complaint  Patient presents with   Abdominal Pain    HPI Alejandro Fox is a 72 y.o. male.   HPI  [name] presents for upper abdominal pain for the past 1-2 days. Has soreness like pain.  Felt like he needed to burp and took a gas pill but that didn't help. He took some pepto bismol this morning.   Reports bowel movement this morning and this afternoon. He had to strain to poop this morning. He stopped to [employer] to get a classic roast beef but wasn't able to finish the sandwich or fries due to pain. Nothing makes the pain better. Eating makes pain worse. Last colonoscopy was 4 years ago.  He was told to come back by now. Has history of kidney stones but doesn't remember passing.    Past Surgeries: no abdominal surgeries   Symptoms Nausea/Vomiting: no  Diarrhea: no  Constipation: denies   Melena/BRBPR: no  Hematemesis: no  Anorexia: no  Fever/Chills: no  Dysuria: no  Hematuria: no  Rash: no  Wt loss: no  EtOH use: yes beer  NSAIDs/ASA: no  Sore throat: no   Cough: no Nasal congestion : no  Sleep disturbance: yes Back Pain: not new  Headache: no   Past Medical History:  Diagnosis Date   Erectile dysfunction    GERD (gastroesophageal reflux disease)    Hyperlipidemia    Seasonal allergies     Patient Active Problem List   Diagnosis Date Noted   Disorder of bursae of shoulder region 10/11/2023   Enthesopathy of hip region 09/14/2018   Erectile dysfunction 09/14/2018   Hyperlipidemia 09/14/2018   Seasonal allergic rhinitis due to pollen 01/27/2017   Atherosclerosis of abdominal aorta (HCC) 11/01/2016   GERD (gastroesophageal reflux disease) 05/08/2014    Past Surgical History:  Procedure Laterality Date   COLONOSCOPY WITH PROPOFOL N/A 07/13/2015   Procedure: COLONOSCOPY WITH PROPOFOL;  Surgeon: Christena Deem, MD;  Location: [facility] ENDOSCOPY;   Service: Endoscopy;  Laterality: N/A;   COLONOSCOPY WITH PROPOFOL N/A 01/15/2019   Procedure: COLONOSCOPY WITH PROPOFOL;  Surgeon: Christena Deem, MD;  Location: [facility] ENDOSCOPY;  Service: Endoscopy;  Laterality: N/A;       Home Medications    Prior to Admission medications   Medication Sig Start Date End Date Taking? Authorizing Provider  cholecalciferol (VITAMIN D) 400 UNITS TABS tablet Take 1,000 Units by mouth.   Yes [provider]  fluticasone (FLONASE) 50 MCG/ACT nasal spray Place into the nose.   Yes [provider]  meloxicam (MOBIC) 15 MG tablet Take 1 tablet by mouth daily. 09/19/23  Yes [provider]  Multiple Vitamin (MULTIVITAMIN WITH MINERALS) TABS tablet Take 1 tablet by mouth daily.   Yes [provider]  omega-3 acid ethyl esters (LOVAZA) 1 G capsule Take 2 g by mouth daily.   Yes [provider]  rosuvastatin (CRESTOR) 5 MG tablet Take 5 mg by mouth daily.   Yes [provider]  tadalafil (CIALIS) 20 MG tablet Take 20 mg by mouth daily as needed for erectile dysfunction.   Yes [provider]  chlorpheniramine-HYDROcodone (TUSSIONEX PENNKINETIC ER) 10-8 MG/5ML SUER Take 5 mLs by mouth at bedtime as needed. Patient not taking: Reported on 01/15/2019 11/15/18   Tommie Sams, DO  pantoprazole (PROTONIX) 40 MG tablet Take 1 tablet (40 mg  total) by mouth daily. 05/21/18   Tommie Sams, DO  predniSONE (DELTASONE) 50 MG tablet 1 tablet daily x 5 days Patient not taking: Reported on 01/15/2019 11/15/18   Tommie Sams, DO    Family History Family History  Problem Relation Age of Onset   Other Mother    Diabetes Father    Hypertension Father     Social History Social History   Tobacco Use   Smoking status: Some Days   Smokeless tobacco: Never  Vaping Use   Vaping status: Never Used  Substance Use Topics   Alcohol use: Yes    Comment: occasionally   Drug use: No     Allergies    Ibuprofen   Review of Systems Review of Systems :negative unless otherwise stated in HPI.      Physical Exam Triage Vital Signs ED Triage Vitals  Encounter Vitals Group     BP 10/11/23 1405 (!) 157/87     Systolic BP Percentile --      Diastolic BP Percentile --      Pulse Rate 10/11/23 1405 67     Resp 10/11/23 1405 16     Temp 10/11/23 1405 98.1 F (36.7 C)     Temp Source 10/11/23 1405 Oral     SpO2 10/11/23 1405 95 %     Weight 10/11/23 1404 160 lb (72.6 kg)     Height 10/11/23 1404 5' 6.5" (1.689 m)     Head Circumference --      Peak Flow --      Pain Score 10/11/23 1408 8     Pain Loc --      Pain Education --      Exclude from Growth Chart --    No data found.  Updated Vital Signs BP (!) 157/87 (BP Location: Right Arm)   Pulse 67   Temp 98.1 F (36.7 C) (Oral)   Resp 16   Ht 5' 6.5" (1.689 m)   Wt 72.6 kg   SpO2 95%   BMI 25.44 kg/m   Visual Acuity Right Eye Distance:   Left Eye Distance:   Bilateral Distance:    Right Eye Near:   Left Eye Near:    Bilateral Near:     Physical Exam  GEN: pleasant well appearing male, in no acute distress *** CV: regular rate and rhythm, no murmurs appreciated *** RESP: no increased work of breathing, clear to ascultation bilaterally ABD: Bowel sounds present. Soft,***non-tender,***non-distended. ***No guarding,***no rebound,***no appreciable hepatosplenomegaly,***no CVA tenderness,***negative McBurney's,***negative Murphy MSK: no extremity edema SKIN: warm, dry, no rash on visible skin NEURO: alert, moves all extremities appropriately PSYCH: Normal affect, appropriate speech and behavior   UC Treatments / Results  Labs (all labs ordered are listed, but only abnormal results are displayed) Labs Reviewed - No data to display  EKG  If EKG performed, see my interpretation and MDM section  Radiology No results found.   Procedures Procedures (including critical care time)  Medications Ordered in  UC Medications - No data to display  Initial Impression / Assessment and Plan / UC Course  I have reviewed the triage vital signs and the nursing notes.  Pertinent labs & imaging results that were available during my care of the patient were reviewed by me and considered in my medical decision making (see chart for details).     ***  Patient is a  72 y.o. malewith history *** who presents after having insidious severe abdominal pain about ***  ago.    Had colonoscopy at [facility] performed on 01/15/19 and found  - One 5 mm polyp in the mid sigmoid colon, removed with a cold snare. Resected and retrieved. - Diverticulosis in the sigmoid colon, in the descending colon and at the splenic flexure. - Non- bleeding internal hemorrhoids. Return in 3 years,    Overall, patient is well-appearing, well-hydrated, and in no acute distress.  Vital signs stable.  Julesis afebrile.  Exam is ***not concerning for an acute abdomen.  Obtained UA, urine pregnancy***, CBC, CMP, and lipase.  No personal history of kidney stones.      DDX:  -UTI: Urine unremarkable -STI: Not sexually active -Ovarian torsion versus cyst: Pain is epigastric less likely -Biliary colic/gallstone: Labs today, consider right upper quadrant ultrasound -Pancreatitis: Lipase today -Gastroenteritis: Not likely given no vomiting or diarrhea -Kidney stone: No hematuria on UA, no CVA tenderness -Constipation: None reported -Appendicitis: status post recent appendectomy   Constipation: ***Provided and reviewed constipation clean out and maintenance plan with patient and ***. Discussed eating small meals frequently and increased fluid intake, as well as gradual increase in fiber intake with dried fruits, vegetables with skins, beans, whole grains and cereal. Goal ***25-35g of fiber per day. Encouraged drinking hot beverages and prune juice; add probiotic-containing foods like pasteurized yogurt and kefir; encourage increased physical  activity.     Follow-up, return and ED precautions given.  Discussed MDM, treatment plan and plan for follow-up with patient/parent who agrees with plan.    Final Clinical Impressions(s) / UC Diagnoses   Final diagnoses:  None   Discharge Instructions   None    ED Prescriptions   None    PDMP not reviewed this encounter.

## 2023-10-13 DIAGNOSIS — Z1211 Encounter for screening for malignant neoplasm of colon: Secondary | ICD-10-CM | POA: Diagnosis not present

## 2023-10-13 DIAGNOSIS — Z09 Encounter for follow-up examination after completed treatment for conditions other than malignant neoplasm: Secondary | ICD-10-CM | POA: Diagnosis not present

## 2023-10-13 DIAGNOSIS — K852 Alcohol induced acute pancreatitis without necrosis or infection: Secondary | ICD-10-CM | POA: Diagnosis not present

## 2024-01-03 DIAGNOSIS — M2550 Pain in unspecified joint: Secondary | ICD-10-CM | POA: Diagnosis not present

## 2024-01-03 DIAGNOSIS — I700 Atherosclerosis of aorta: Secondary | ICD-10-CM | POA: Diagnosis not present

## 2024-01-03 DIAGNOSIS — E7800 Pure hypercholesterolemia, unspecified: Secondary | ICD-10-CM | POA: Diagnosis not present

## 2024-01-03 DIAGNOSIS — E119 Type 2 diabetes mellitus without complications: Secondary | ICD-10-CM | POA: Diagnosis not present

## 2024-01-23 DIAGNOSIS — M79671 Pain in right foot: Secondary | ICD-10-CM | POA: Diagnosis not present

## 2024-01-23 DIAGNOSIS — M79672 Pain in left foot: Secondary | ICD-10-CM | POA: Diagnosis not present

## 2024-02-21 DIAGNOSIS — Z8719 Personal history of other diseases of the digestive system: Secondary | ICD-10-CM | POA: Diagnosis not present

## 2024-02-21 DIAGNOSIS — Z09 Encounter for follow-up examination after completed treatment for conditions other than malignant neoplasm: Secondary | ICD-10-CM | POA: Diagnosis not present

## 2024-02-21 DIAGNOSIS — Z860101 Personal history of adenomatous and serrated colon polyps: Secondary | ICD-10-CM | POA: Diagnosis not present

## 2024-02-28 DIAGNOSIS — N200 Calculus of kidney: Secondary | ICD-10-CM | POA: Diagnosis not present

## 2024-02-28 DIAGNOSIS — N529 Male erectile dysfunction, unspecified: Secondary | ICD-10-CM | POA: Diagnosis not present

## 2024-02-28 DIAGNOSIS — I700 Atherosclerosis of aorta: Secondary | ICD-10-CM | POA: Diagnosis not present

## 2024-02-28 DIAGNOSIS — Z0000 Encounter for general adult medical examination without abnormal findings: Secondary | ICD-10-CM | POA: Diagnosis not present

## 2024-02-28 DIAGNOSIS — K219 Gastro-esophageal reflux disease without esophagitis: Secondary | ICD-10-CM | POA: Diagnosis not present

## 2024-02-28 DIAGNOSIS — E7800 Pure hypercholesterolemia, unspecified: Secondary | ICD-10-CM | POA: Diagnosis not present

## 2024-02-28 DIAGNOSIS — H524 Presbyopia: Secondary | ICD-10-CM | POA: Diagnosis not present

## 2024-02-28 DIAGNOSIS — M2550 Pain in unspecified joint: Secondary | ICD-10-CM | POA: Diagnosis not present

## 2024-02-28 DIAGNOSIS — D649 Anemia, unspecified: Secondary | ICD-10-CM | POA: Diagnosis not present

## 2024-02-28 DIAGNOSIS — E119 Type 2 diabetes mellitus without complications: Secondary | ICD-10-CM | POA: Diagnosis not present

## 2024-02-29 DIAGNOSIS — E7800 Pure hypercholesterolemia, unspecified: Secondary | ICD-10-CM | POA: Diagnosis not present

## 2024-02-29 DIAGNOSIS — D649 Anemia, unspecified: Secondary | ICD-10-CM | POA: Diagnosis not present

## 2024-02-29 DIAGNOSIS — E119 Type 2 diabetes mellitus without complications: Secondary | ICD-10-CM | POA: Diagnosis not present

## 2024-02-29 DIAGNOSIS — H524 Presbyopia: Secondary | ICD-10-CM | POA: Diagnosis not present

## 2024-04-08 DIAGNOSIS — S0501XA Injury of conjunctiva and corneal abrasion without foreign body, right eye, initial encounter: Secondary | ICD-10-CM | POA: Diagnosis not present

## 2024-04-12 DIAGNOSIS — S0501XA Injury of conjunctiva and corneal abrasion without foreign body, right eye, initial encounter: Secondary | ICD-10-CM | POA: Diagnosis not present

## 2024-04-23 ENCOUNTER — Ambulatory Visit: Admitting: General Practice

## 2024-04-23 ENCOUNTER — Other Ambulatory Visit: Payer: Self-pay

## 2024-04-23 ENCOUNTER — Encounter: Payer: Self-pay | Admitting: *Deleted

## 2024-04-23 ENCOUNTER — Encounter
Admission: RE | Disposition: A | Discharge status: Home / Self Care | Payer: Self-pay | Source: Home / Self Care | Attending: Gastroenterology

## 2024-04-23 ENCOUNTER — Ambulatory Visit
Admission: RE | Admit: 2024-04-23 | Discharge: 2024-04-23 | Disposition: A | Discharge status: Home / Self Care | Attending: Gastroenterology | Admitting: Gastroenterology

## 2024-04-23 DIAGNOSIS — Z09 Encounter for follow-up examination after completed treatment for conditions other than malignant neoplasm: Secondary | ICD-10-CM | POA: Diagnosis not present

## 2024-04-23 DIAGNOSIS — K641 Second degree hemorrhoids: Secondary | ICD-10-CM | POA: Diagnosis not present

## 2024-04-23 DIAGNOSIS — Z1211 Encounter for screening for malignant neoplasm of colon: Secondary | ICD-10-CM | POA: Insufficient documentation

## 2024-04-23 DIAGNOSIS — K5730 Diverticulosis of large intestine without perforation or abscess without bleeding: Secondary | ICD-10-CM | POA: Diagnosis not present

## 2024-04-23 DIAGNOSIS — E785 Hyperlipidemia, unspecified: Secondary | ICD-10-CM | POA: Insufficient documentation

## 2024-04-23 DIAGNOSIS — K5520 Angiodysplasia of colon without hemorrhage: Secondary | ICD-10-CM | POA: Insufficient documentation

## 2024-04-23 DIAGNOSIS — Z860101 Personal history of adenomatous and serrated colon polyps: Secondary | ICD-10-CM | POA: Diagnosis not present

## 2024-04-23 DIAGNOSIS — K649 Unspecified hemorrhoids: Secondary | ICD-10-CM | POA: Diagnosis not present

## 2024-04-23 HISTORY — PX: COLONOSCOPY: SHX5424

## 2024-04-23 SURGERY — COLONOSCOPY
Anesthesia: General

## 2024-04-23 MED ORDER — SODIUM CHLORIDE 0.9 % IV SOLN: INTRAVENOUS | Status: DC

## 2024-04-23 MED ORDER — PROPOFOL 10 MG/ML IV BOLUS
INTRAVENOUS | Status: DC | PRN
  Administered 2024-04-23 (×2): 50 mg via INTRAVENOUS

## 2024-04-23 MED ORDER — PROPOFOL 500 MG/50ML IV EMUL
INTRAVENOUS | Status: DC | PRN
  Administered 2024-04-23: 75 ug/kg/min via INTRAVENOUS

## 2024-04-23 NOTE — H&P (Signed)
 Outpatient short stay form Pre-procedure 04/23/2024  Shane Darling, MD  Primary Physician: Lorrie Rothman, MD  Reason for visit:  Surveillance colonoscopy  History of present illness:    73 y/o gentleman with history of HLD here for surveillance colonoscopy. Last colonoscopy in 2020 was unremarkable. No blood thinners. No family history of GI malignancies. No significant abdominal surgeries.    Current Facility-Administered Medications:    0.9 %  sodium chloride  infusion, , Intravenous, Continuous, Daundre Biel, Leanora Prophet, MD, Last Rate: 20 mL/hr at 04/23/24 0847, New Bag at 04/23/24 0847  Medications Prior to Admission  Medication Sig Dispense Refill Last Dose/Taking   cholecalciferol (VITAMIN D) 400 UNITS TABS tablet Take 1,000 Units by mouth.   Past Week   fluticasone (FLONASE) 50 MCG/ACT nasal spray Place into the nose.   Past Week   HYDROcodone -acetaminophen  (NORCO/VICODIN) 5-325 MG tablet Take 1-2 tablets by mouth every 4 (four) hours as needed. 15 tablet 0 Past Week   meloxicam (MOBIC) 15 MG tablet Take 1 tablet by mouth daily.   Past Week   Multiple Vitamin (MULTIVITAMIN WITH MINERALS) TABS tablet Take 1 tablet by mouth daily.   Past Week   omega-3 acid ethyl esters (LOVAZA) 1 G capsule Take 2 g by mouth daily.   Past Week   pantoprazole  (PROTONIX ) 40 MG tablet Take 1 tablet (40 mg total) by mouth daily. 30 tablet 0 Past Month   rosuvastatin (CRESTOR) 5 MG tablet Take 5 mg by mouth daily.   Past Week   tadalafil (CIALIS) 20 MG tablet Take 20 mg by mouth daily as needed for erectile dysfunction.        Allergies  Allergen Reactions   Ibuprofen Other (See Comments)     Past Medical History:  Diagnosis Date   Erectile dysfunction    GERD (gastroesophageal reflux disease)    Hyperlipidemia    Seasonal allergies     Review of systems:  Otherwise negative.    Physical Exam  Gen: Alert, oriented. Appears stated age.  HEENT: PERRLA. Lungs: No respiratory  distress CV: RRR Abd: soft, benign, no masses Ext: No edema    Planned procedures: Proceed with colonoscopy. The patient understands the nature of the planned procedure, indications, risks, alternatives and potential complications including but not limited to bleeding, infection, perforation, damage to internal organs and possible oversedation/side effects from anesthesia. The patient agrees and gives consent to proceed.  Please refer to procedure notes for findings, recommendations and patient disposition/instructions.     Shane Darling, MD [facility]

## 2024-04-23 NOTE — Op Note (Signed)
 [facility]enter[facility] Patient Name: Alejandro Fox Procedure Date: 04/23/2024 8:57 AM MRN: [id number] Account #: [id number] Date of Birth: [birth year] Admit Type: Outpatient Age: 73 Room: [facility] ENDO ROOM 1 Gender: Male Note Status: Finalized Instrument Name: Charlyn Cooley [id number] Procedure:             Colonoscopy Indications:           High risk colon cancer surveillance: Personal history                         of colonic polyps, Last colonoscopy 5 years ago Providers:             Leida Puna MD, MD Referring MD:          Lorrie Rothman (Referring MD) Medicines:             Monitored Anesthesia Care Complications:         No immediate complications. Estimated blood loss:                         Minimal. Procedure:             Pre-Anesthesia Assessment:                        - Prior to the procedure, a History and Physical was                         performed, and patient medications and allergies were                         reviewed. The patient is competent. The risks and                         benefits of the procedure and the sedation options and                         risks were discussed with the patient. All questions                         were answered and informed consent was obtained.                         Patient identification and proposed procedure were                         verified by the physician, the nurse, the                         anesthesiologist, the anesthetist and the technician                         in the endoscopy suite. Mental Status Examination:                         alert and oriented. Airway Examination: normal                         oropharyngeal airway and neck mobility. Respiratory  Examination: clear to auscultation. CV Examination:                         normal. Prophylactic Antibiotics: The patient does not                         require prophylactic antibiotics. Prior                          Anticoagulants: The patient has taken no anticoagulant                         or antiplatelet agents. ASA Grade Assessment: II - A                         patient with mild systemic disease. After reviewing                         the risks and benefits, the patient was deemed in                         satisfactory condition to undergo the procedure. The                         anesthesia plan was to use monitored anesthesia care                         (MAC). Immediately prior to administration of                         medications, the patient was re-assessed for adequacy                         to receive sedatives. The heart rate, respiratory                         rate, oxygen saturations, blood pressure, adequacy of                         pulmonary ventilation, and response to care were                         monitored throughout the procedure. The physical                         status of the patient was re-assessed after the                         procedure.                        After obtaining informed consent, the colonoscope was                         passed under direct vision. Throughout the procedure,                         the patient's blood pressure, pulse, and oxygen  saturations were monitored continuously. The                         Colonoscope was introduced through the anus and                         advanced to the the terminal ileum, with                         identification of the appendiceal orifice and IC                         valve. The colonoscopy was performed without                         difficulty. The patient tolerated the procedure well.                         The quality of the bowel preparation was good. The                         terminal ileum, ileocecal valve, appendiceal orifice,                         and rectum were photographed. Findings:      The perianal and digital rectal  examinations were normal.      The terminal ileum appeared normal.      A single small localized angioectasia without bleeding was found in the       cecum.      A few small-mouthed diverticula were found in the sigmoid colon.      Internal hemorrhoids were found during retroflexion. The hemorrhoids       were Grade II (internal hemorrhoids that prolapse but reduce       spontaneously).      The exam was otherwise without abnormality on direct and retroflexion       views. Impression:            - The examined portion of the ileum was normal.                        - A single non-bleeding colonic angioectasia.                        - Diverticulosis in the sigmoid colon.                        - Internal hemorrhoids.                        - The examination was otherwise normal on direct and                         retroflexion views.                        - No specimens collected. Recommendation:        - Discharge patient to home.                        - Resume previous diet.                        -  Continue present medications.                        - Repeat colonoscopy is not recommended due to current                         age (40 years or older) for surveillance.                        - Return to referring physician as previously                         scheduled. Procedure Code(s):     --- Professional ---                        N6295, Colorectal cancer screening; colonoscopy on                         individual at high risk Diagnosis Code(s):     --- Professional ---                        Z86.010, Personal history of colonic polyps                        K64.1, Second degree hemorrhoids                        K55.20, Angiodysplasia of colon without hemorrhage                        K57.30, Diverticulosis of large intestine without                         perforation or abscess without bleeding CPT copyright 2022 American Medical Association. All rights reserved. The  codes documented in this report are preliminary and upon coder review may  be revised to meet current compliance requirements. Leida Puna MD, MD 04/23/2024 9:18:30 AM Number of Addenda: 0 Note Initiated On: 04/23/2024 8:57 AM Scope Withdrawal Time: 0 hours 6 minutes 55 seconds  Total Procedure Duration: 0 hours 9 minutes 13 seconds  Estimated Blood Loss:  Estimated blood loss was minimal.      [facility]

## 2024-04-23 NOTE — Interval H&P Note (Signed)
 History and Physical Interval Note:  04/23/2024 8:56 AM  Alejandro Fox  has presented today for surgery, with the diagnosis of h/o ta polyps.  The various methods of treatment have been discussed with the patient and family. After consideration of risks, benefits and other options for treatment, the patient has consented to  Procedure(s): COLONOSCOPY (N/A) as a surgical intervention.  The patient's history has been reviewed, patient examined, no change in status, stable for surgery.  I have reviewed the patient's chart and labs.  Questions were answered to the patient's satisfaction.     Shane Darling  Ok to proceed with colonoscopy

## 2024-04-23 NOTE — Transfer of Care (Signed)
 Immediate Anesthesia Transfer of Care Note  Patient: Alejandro Fox  Procedure(s) Performed: COLONOSCOPY  Patient Location: PACU  Anesthesia Type:MAC  Level of Consciousness: drowsy  Airway & Oxygen Therapy: Patient Spontanous Breathing  Post-op Assessment: Report given to RN and Post -op Vital signs reviewed and stable  Post vital signs: Reviewed and stable  Last Vitals:  Vitals Value Taken Time  BP 119/79 04/23/24 0917  Temp    Pulse 66 04/23/24 0918  Resp 18 04/23/24 0918  SpO2 100 % 04/23/24 0918  Vitals shown include unfiled device data.  Last Pain:  Vitals:   04/23/24 0836  TempSrc: Temporal  PainSc: 0-No pain         Complications: No notable events documented.

## 2024-04-23 NOTE — Anesthesia Preprocedure Evaluation (Signed)
 Anesthesia Evaluation  Patient identified by MRN, date of birth, ID band Patient awake    Reviewed: Allergy & Precautions, NPO status , Patient's Chart, lab work & pertinent test results  History of Anesthesia Complications Negative for: history of anesthetic complications  Airway Mallampati: II  TM Distance: >3 FB Neck ROM: Full    Dental  (+) Partial Upper   Pulmonary neg sleep apnea, neg COPD, Current Smoker   breath sounds clear to auscultation- rhonchi (-) wheezing      Cardiovascular Exercise Tolerance: Good (-) hypertension(-) CAD, (-) Past MI, (-) Cardiac Stents and (-) CABG  Rhythm:Regular Rate:Normal - Systolic murmurs and - Diastolic murmurs    Neuro/Psych neg Seizures negative neurological ROS  negative psych ROS   GI/Hepatic Neg liver ROS,GERD  ,,  Endo/Other  negative endocrine ROSneg diabetes    Renal/GU      Musculoskeletal   Abdominal   Peds  Hematology negative hematology ROS (+)   Anesthesia Other Findings Past Medical History: No date: Erectile dysfunction No date: GERD (gastroesophageal reflux disease) No date: Hyperlipidemia No date: Seasonal allergies   Reproductive/Obstetrics                             Anesthesia Physical Anesthesia Plan  ASA: 2  Anesthesia Plan: General   Post-op Pain Management: Minimal or no pain anticipated   Induction: Intravenous  PONV Risk Score and Plan: 3 and Propofol  infusion, TIVA and Ondansetron  Airway Management Planned: Nasal Cannula  Additional Equipment: None  Intra-op Plan:   Post-operative Plan:   Informed Consent: I have reviewed the patients History and Physical, chart, labs and discussed the procedure including the risks, benefits and alternatives for the proposed anesthesia with the patient or authorized representative who has indicated his/her understanding and acceptance.     Dental advisory  given  Plan Discussed with: CRNA and Surgeon  Anesthesia Plan Comments: (Discussed risks of anesthesia with patient, including possibility of difficulty with spontaneous ventilation under anesthesia necessitating airway intervention, PONV, and rare risks such as cardiac or respiratory or neurological events, and allergic reactions. Discussed the role of CRNA in patient's perioperative care. Patient understands.)       Anesthesia Quick Evaluation

## 2024-04-23 NOTE — Anesthesia Postprocedure Evaluation (Signed)
 Anesthesia Post Note  Patient: Alejandro Fox  Procedure(s) Performed: COLONOSCOPY  Patient location during evaluation: Endoscopy Anesthesia Type: General Level of consciousness: awake and alert Pain management: pain level controlled Vital Signs Assessment: post-procedure vital signs reviewed and stable Respiratory status: spontaneous breathing, nonlabored ventilation, respiratory function stable and patient connected to nasal cannula oxygen Cardiovascular status: blood pressure returned to baseline and stable Postop Assessment: no apparent nausea or vomiting Anesthetic complications: no  No notable events documented.   Last Vitals:  Vitals:   04/23/24 0928 04/23/24 0938  BP: (!) 152/85 (!) 172/84  Pulse: 64 61  Resp: 16 13  Temp:    SpO2: 100% 100%    Last Pain:  Vitals:   04/23/24 0938  TempSrc:   PainSc: 0-No pain                 Enrique Harvest

## 2024-04-24 ENCOUNTER — Encounter: Payer: Self-pay | Admitting: Gastroenterology

## 2024-08-01 DIAGNOSIS — S61211A Laceration without foreign body of left index finger without damage to nail, initial encounter: Secondary | ICD-10-CM | POA: Diagnosis not present

## 2024-09-03 DIAGNOSIS — M2550 Pain in unspecified joint: Secondary | ICD-10-CM | POA: Diagnosis not present

## 2024-09-03 DIAGNOSIS — N529 Male erectile dysfunction, unspecified: Secondary | ICD-10-CM | POA: Diagnosis not present

## 2024-09-03 DIAGNOSIS — E119 Type 2 diabetes mellitus without complications: Secondary | ICD-10-CM | POA: Diagnosis not present

## 2024-09-03 DIAGNOSIS — N200 Calculus of kidney: Secondary | ICD-10-CM | POA: Diagnosis not present

## 2024-09-03 DIAGNOSIS — E7800 Pure hypercholesterolemia, unspecified: Secondary | ICD-10-CM | POA: Diagnosis not present

## 2024-09-03 DIAGNOSIS — Z0000 Encounter for general adult medical examination without abnormal findings: Secondary | ICD-10-CM | POA: Diagnosis not present

## 2024-09-03 DIAGNOSIS — Z125 Encounter for screening for malignant neoplasm of prostate: Secondary | ICD-10-CM | POA: Diagnosis not present

## 2024-09-03 DIAGNOSIS — F17210 Nicotine dependence, cigarettes, uncomplicated: Secondary | ICD-10-CM | POA: Diagnosis not present

## 2024-09-03 DIAGNOSIS — K219 Gastro-esophageal reflux disease without esophagitis: Secondary | ICD-10-CM | POA: Diagnosis not present

## 2024-10-10 NOTE — Progress Notes (Signed)
 Alejandro Fox                                          MRN: [id number]   10/10/2024   The [facility] Quality Team Specialist reviewed this patient medical record for the purposes of chart review for care gap closure. The following were reviewed: abstraction for care gap closure-kidney health evaluation for diabetes:eGFR  and uACR.    [facility] Quality Team
